# Patient Record
Sex: Female | Born: 1975 | Race: Black or African American | Hispanic: No | Marital: Married | Smoking: Never smoker
Health system: Southern US, Community
[De-identification: ages and names within clinical notes are randomized; demographics above are authoritative.]

## PROBLEM LIST (undated history)

## (undated) DIAGNOSIS — R110 Nausea: Secondary | ICD-10-CM

## (undated) DIAGNOSIS — G4700 Insomnia, unspecified: Secondary | ICD-10-CM

## (undated) DIAGNOSIS — I10 Essential (primary) hypertension: Secondary | ICD-10-CM

## (undated) DIAGNOSIS — F53 Postpartum depression: Secondary | ICD-10-CM

## (undated) DIAGNOSIS — R6889 Other general symptoms and signs: Secondary | ICD-10-CM

## (undated) DIAGNOSIS — F329 Major depressive disorder, single episode, unspecified: Secondary | ICD-10-CM

## (undated) DIAGNOSIS — D649 Anemia, unspecified: Secondary | ICD-10-CM

## (undated) DIAGNOSIS — F32A Depression, unspecified: Secondary | ICD-10-CM

## (undated) DIAGNOSIS — O99345 Other mental disorders complicating the puerperium: Secondary | ICD-10-CM

## (undated) DIAGNOSIS — N920 Excessive and frequent menstruation with regular cycle: Secondary | ICD-10-CM

## (undated) HISTORY — DX: Excessive and frequent menstruation with regular cycle: N92.0

## (undated) HISTORY — PX: OTHER SURGICAL HISTORY: SHX169

## (undated) HISTORY — DX: Other general symptoms and signs: R68.89

## (undated) HISTORY — DX: Postpartum depression: F53.0

## (undated) HISTORY — DX: Depression, unspecified: F32.A

## (undated) HISTORY — DX: Anemia, unspecified: D64.9

## (undated) HISTORY — DX: Insomnia, unspecified: G47.00

## (undated) HISTORY — DX: Major depressive disorder, single episode, unspecified: F32.9

## (undated) HISTORY — DX: Nausea: R11.0

## (undated) HISTORY — DX: Essential (primary) hypertension: I10

## (undated) HISTORY — DX: Other mental disorders complicating the puerperium: O99.345

---

## 2006-01-06 DIAGNOSIS — R6889 Other general symptoms and signs: Secondary | ICD-10-CM

## 2006-01-06 DIAGNOSIS — H579 Unspecified disorder of eye and adnexa: Secondary | ICD-10-CM

## 2006-01-06 HISTORY — DX: Other general symptoms and signs: R68.89

## 2006-01-06 HISTORY — DX: Unspecified disorder of eye and adnexa: H57.9

## 2006-05-21 ENCOUNTER — Inpatient Hospital Stay (HOSPITAL_COMMUNITY): Admission: AD | Admit: 2006-05-21 | Discharge: 2006-05-21 | Payer: Self-pay | Admitting: Obstetrics and Gynecology

## 2006-06-28 ENCOUNTER — Inpatient Hospital Stay (HOSPITAL_COMMUNITY): Admission: AD | Admit: 2006-06-28 | Discharge: 2006-06-28 | Payer: Self-pay | Admitting: Obstetrics and Gynecology

## 2006-08-08 ENCOUNTER — Inpatient Hospital Stay (HOSPITAL_COMMUNITY): Admission: AD | Admit: 2006-08-08 | Discharge: 2006-08-08 | Payer: Self-pay | Admitting: Obstetrics and Gynecology

## 2006-08-15 ENCOUNTER — Inpatient Hospital Stay (HOSPITAL_COMMUNITY): Admission: AD | Admit: 2006-08-15 | Discharge: 2006-08-16 | Payer: Self-pay | Admitting: Obstetrics and Gynecology

## 2006-08-25 ENCOUNTER — Inpatient Hospital Stay (HOSPITAL_COMMUNITY): Admission: AD | Admit: 2006-08-25 | Discharge: 2006-08-27 | Payer: Self-pay | Admitting: Obstetrics and Gynecology

## 2006-08-30 ENCOUNTER — Observation Stay (HOSPITAL_COMMUNITY): Admission: AD | Admit: 2006-08-30 | Discharge: 2006-08-30 | Payer: Self-pay | Admitting: Obstetrics and Gynecology

## 2006-10-05 ENCOUNTER — Inpatient Hospital Stay (HOSPITAL_COMMUNITY): Admission: AD | Admit: 2006-10-05 | Discharge: 2006-10-05 | Payer: Self-pay | Admitting: Obstetrics and Gynecology

## 2006-10-09 DIAGNOSIS — G4700 Insomnia, unspecified: Secondary | ICD-10-CM

## 2006-10-09 DIAGNOSIS — R110 Nausea: Secondary | ICD-10-CM

## 2006-10-09 DIAGNOSIS — D649 Anemia, unspecified: Secondary | ICD-10-CM

## 2006-10-09 DIAGNOSIS — O99345 Other mental disorders complicating the puerperium: Secondary | ICD-10-CM

## 2006-10-09 DIAGNOSIS — F53 Postpartum depression: Secondary | ICD-10-CM

## 2006-10-09 HISTORY — DX: Insomnia, unspecified: G47.00

## 2006-10-09 HISTORY — DX: Nausea: R11.0

## 2006-10-09 HISTORY — DX: Postpartum depression: F53.0

## 2006-10-09 HISTORY — DX: Other mental disorders complicating the puerperium: O99.345

## 2006-10-09 HISTORY — DX: Anemia, unspecified: D64.9

## 2007-06-01 ENCOUNTER — Other Ambulatory Visit: Admission: RE | Admit: 2007-06-01 | Discharge: 2007-06-01 | Payer: Self-pay | Admitting: Obstetrics and Gynecology

## 2008-01-12 ENCOUNTER — Emergency Department (HOSPITAL_COMMUNITY): Admission: EM | Admit: 2008-01-12 | Discharge: 2008-01-12 | Payer: Self-pay | Admitting: Emergency Medicine

## 2009-08-18 DIAGNOSIS — N920 Excessive and frequent menstruation with regular cycle: Secondary | ICD-10-CM

## 2009-08-18 HISTORY — DX: Excessive and frequent menstruation with regular cycle: N92.0

## 2010-07-17 ENCOUNTER — Emergency Department (HOSPITAL_COMMUNITY): Admission: EM | Admit: 2010-07-17 | Discharge: 2010-07-17 | Payer: Self-pay | Admitting: Emergency Medicine
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
## 2011-01-21 NOTE — Op Note (Signed)
[name], Amanda [name]              ACCOUNT NO.:  [id number]   MEDICAL RECORD NO.:  [id number]          PATIENT TYPE:  INP   LOCATION:  9164                          [facility]   PHYSICIAN:  Crist Fat. Rivard, M.D. DATE OF BIRTH:  09-22-1975   DATE OF PROCEDURE:  08/25/2006  DATE OF DISCHARGE:                               OPERATIVE REPORT   PREOPERATIVE DIAGNOSIS:  Postpartum hemorrhage.   POSTOPERATIVE DIAGNOSIS:  Postpartum hemorrhage.   ANESTHESIA:  Saddle block by Dr. Arby Barrette   PROCEDURE:  Dilatation and curettage.   SURGEON:  Crist Fat. Rivard, M.D.   ESTIMATED BLOOD LOSS:  800 mL.   DESCRIPTION OF PROCEDURE:  After being informed of the planned procedure  with possible complications, the patient is taken from labor and  delivery to OR #3.  She is given a saddle block by Dr. Arby Barrette with no  complication and placed in the lithotomy position.  She is prepped and  draped in a sterile fashion and a weighted speculum is inserted in her  vagina.  With a Deaver retractor, we are able to identify the anterior  lip of the cervix which is grasped with a sponge forceps.  Using a  second sponge forceps, we evaluate the entire cervix to note no cervical  laceration.  A large curet is then inserted in the uterus and the uterus  was systematically curetted returning only blood clots, no tissue was  identified.  Instruments were removed and a bimanual massage was  performed as well as a manual revision of the uterus which reveals a  firm product free cavity.  During the procedure, the patient is given a  perfusion of 40 units of Pitocin in 1000 mL of LR as well as two doses  of Methergine 0.2 mg IM. We then observed for another five minutes and  bleeding had subsided. A Foley catheter is inserted and it will remain  in the bladder until tomorrow morning.   Instruments and sponge count is complete x2.  Estimated blood loss  before and during procedure is 800 mL for a total blood  loss during this  delivery process of 1200 mL. The patient has remained normotensive and  with a normal heart rate and has tolerated the procedure very well.  She  is taken in a stable condition to the PACU.      Crist Fat Rivard, M.D.  Electronically Signed     SAR/MEDQ  D:  08/25/2006  T:  08/25/2006  Job:  [id number]

## 2011-01-21 NOTE — Discharge Summary (Signed)
NAMEALDENA, Amanda [name]              ACCOUNT NO.:  [id number]   MEDICAL RECORD NO.:  [id number]          PATIENT TYPE:  INP   LOCATION:  9124                          [facility]   PHYSICIAN:  Janine Limbo, M.D.DATE OF BIRTH:  08-Aug-1976   DATE OF ADMISSION:  08/25/2006  DATE OF DISCHARGE:  08/27/2006                               DISCHARGE SUMMARY   ADMITTING DIAGNOSES:  1. Intrauterine pregnancy at term.  2. Early labor.   PROCEDURE:  Normal spontaneous vaginal delivery.   PREOPERATIVE DIAGNOSIS:  Postpartum hemorrhage.   PROCEDURE:  D&C.   DISCHARGE DIAGNOSES:  1. Intrauterine pregnancy at term delivered.  2. Normal spontaneous vaginal delivery.  3. Postpartum hemorrhage with dilatation and curettage.  4. Anemia.   HOSPITAL COURSE:  Amanda [name] is a 35 year old gravida 2, para 2-0-0-2  who presented at 40-3/7 weeks in early labor.  Her labor progressed and  she went on to normal spontaneous vaginal delivery on August 25, 2006  with Dr. Dois Davenport Rivard in attendance.  The patient gave birth to a 7-  pound 7-ounce female infant named Abigail with Apgar scores of 9 at one  minute and 9 at five minutes.  Uterine atony was noted at time of  delivery and Cytotec 1000 mcg were given per rectum however, bleeding  was not controlled.  The patient continued to have heavy bleeding and  clotting.  She was taken to the OR for a postpartum hemorrhage and D&C  was performed by Dr. Dois Davenport Rivard.  Postop hemoglobin the next morning  was noted to be 6.3.  The patient was symptomatic with her anemia,  however, she is Jehovah's Witness and declined transfusions.  Hetastarch  was reviewed by Dr. Stefano Gaul as a colloid plasma expander with no blood  products.  This was given to the patient and she reports improvement.  She is no longer dizzy when out of bed, no syncope, her orthostatic  vital signs on August 27, 2006 at 6:30 a.m. are all within normal  limits.  Her vital signs have  remained stable.  She is afebrile.  Her  fundus is firm at this point to below the umbilicus with moderate  lochia.  Her physical exam is otherwise within normal limits and she is  judged to be in satisfactory condition for discharge.  Discharge  instructions per [facility] handout.   DISCHARGE MEDICATIONS:  1. Motrin 600 mg p.o. q.6 h. p.r.n. pain.  2. Tylox one to two p.o. q.3-4 h. p.r.n. pain.  3. Prenatal vitamins and iron supplement p.o. b.i.d.   DISCHARGE INSTRUCTIONS:  The patient will rest at home and increase  fluids, iron rich foods were also discussed.  She will call for any  problems or concerns and be seen for her normal postpartum visit at 6  weeks.      Amanda [name], C.N.M.      Janine Limbo, M.D.  Electronically Signed    SDM/MEDQ  D:  08/27/2006  T:  08/27/2006  Job:  [id number]

## 2011-01-21 NOTE — Consult Note (Signed)
NAME:  BABETTA, PATERSON NO.:  [id number]   MEDICAL RECORD NO.:  [id number]          PATIENT TYPE:  MAT   LOCATION:  MATC                          [facility]  [facility]   PHYSICIAN:  Janine Limbo, M.D.DATE OF BIRTH:  11/07/1975   DATE OF CONSULTATION:  DATE OF DISCHARGE:                                 CONSULTATION   HISTORY OF PRESENT ILLNESS:  [name] is a 35 year old female, now  para 2-0-0-2, who presented to the emergency room at the [facility] and then to the maternity admissions area at the [facility] [place] [place] complaining of fatigue and dizziness.  The  patient had a vaginal delivery on August 25, 2006.  She delivered a  healthy female infant that weighed 7 pounds and 7 ounces.  The Apgar  scores were 9 at one minute and 9 at five minutes.  The patient  experienced uterine atony following delivery and multiple medications  were required to control her bleeding.  A dilatation and curettage was  performed.  We were able to stop her bleeding.  Her postpartum  hemoglobin was 6.3.  The patient was noted to be orthostatic.  The  patient was given hetastarch after her delivery and she did feel  somewhat better.  The patient was discharged to home on August 27, 2006.  The patient presents today complaining of fatigue and dizziness.  She reports that she is simply unable to care for her infant.  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   OBSTETRICAL HISTORY:  1. In 2006, the patient had a vaginal delivery of a female infant at      term.  2. On August 25, 2006, she had a vaginal delivery of a healthy      female infant.   PAST MEDICAL HISTORY:  The patient has a history of postpartum  depression.   SOCIAL HISTORY:  Noncontributory.   FAMILY HISTORY:  The patient's has hypertension.   REVIEW OF SYSTEMS:  The patient does have a past history of  constipation.   DRUG ALLERGIES:  NO  KNOWN DRUG ALLERGIES.   PHYSICAL EXAMINATION:  VITAL SIGNS:  Temperature is 98.3, pulse is 88,  blood pressure of 132/71, respirations are 20.  HEENT:  Within normal limits, except that the patient does appear to be  tired.  CHEST: Clear.  HEART:  Regular rate and rhythm and there is a grade 2/6 systolic  ejection murmur.  ABDOMEN:  Slightly tender to palpation.  Her fundus is firm.  EXTREMITIES:  Grossly normal.  NEUROLOGIC:  Grossly normal.  PELVIC:  There is minimal bleeding on the patient's pad.   LABORATORY VALUES:  Hemoglobin today is 5.8.  Hematocrit is 17.  Blood  gas shows a pH of 7.368.  O2 sat is 99% on room air.   ASSESSMENT:  1. Postpartum day number five.  2. Status post uterine atony with the dilatation and curettage.  3. Anemia (hemoglobin 5.8).  4. Orthostasis.  5. Jehovah's Witness - the patient declines all blood products.  6. History of postpartum depression.   PLAN:  A long discussion was held with the patient and her husband.  We  reviewed the patients options when she was in the hospital, and we once  again reviewed her options.  Because she declines blood products, we are  somewhat limited.  We will give another 500 mL of hetastarch.  We will  also give Aranesp 300 mcg subcutaneously.  They understand that this may  make her feel slightly better for the moment.  They also understand that  it will be four to six weeks before she will be able to significantly  increase her hemoglobin using iron therapy.  The patient was again  offered blood products which she declines.  The patient reports that she  has no family members here in town and that perhaps if she can get to  [place] then there will be someone to help care for her and her baby  there.  I offered the patient hospitalization but she understands that  her insurance company may not pay for her to be hospitalized for two-to-  three-to-four weeks until she feels better by building her hemoglobin  with  iron therapy.      Janine Limbo, M.D.  Electronically Signed     AVS/MEDQ  D:  08/30/2006  T:  08/30/2006  Job:  [id number]

## 2011-01-21 NOTE — Discharge Summary (Signed)
Amanda [name], Amanda [name]              ACCOUNT NO.:  [id number]   MEDICAL RECORD NO.:  [id number]          PATIENT TYPE:  OBV   LOCATION:  9399                          [facility]   PHYSICIAN:  Janine Limbo, M.D.DATE OF BIRTH:  01-Nov-1975   DATE OF ADMISSION:  08/30/2006  DATE OF DISCHARGE:  08/30/2006                               DISCHARGE SUMMARY   DISCHARGE DIAGNOSES:  1. Postpartum day #5.  2. Status post uterine atony and status post dilatation and curettage.  3. Anemia (hemoglobin 5.8).  4. Orthostasis.  5. Jehovah's Witness -- the patient declined all blood products.  6. History of postpartum depression.   PROCEDURES THIS ADMISSION:  None.   HISTORY OF PRESENT ILLNESS:  Amanda [name] is a 35 year old female, now  para 2-0-0-2, who presented to the emergency department at [facility]  and then to the maternity admissions area at the [facility]  [place] on August 30, 2006.  The patient had a vaginal delivery on  August 25, 2006, where she delivered a healthy female infant that  weighed 7 pounds 7 ounces.  The patient experienced uterine atony  following her delivery and multiple medications were required to obtain  The patient was orthostatic postpartum and her postpartum hemoglobin was  6.3.  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  She was given hetastarch when in the hospital.  She was  discharged to home on August 27, 2006.  She presents today complaining  of fatigue, dizziness, and reporting that she simply unable to care for  her infant because of how badly she feels.   ADMISSION EXAMINATION:  VITAL SIGNS:  Temperature 98 degrees, pulse 88,  blood pressure 132/71, respirations 20.  GENERAL:  Within normal limits.  CARDIAC:  She did have a grade 2/6 systolic ejection murmur.  PELVIC:  She was not having any bleeding.   HOSPITAL COURSE:  The patient was evaluated in the maternity admissions  area at the [facility].  Her hemoglobin today is  5.7.  Her arterial blood gas was 7.368.  Her O2 saturation was 99% on  room air.  We discussed possible treatment options, which were  essentially the same as when the patient was in the hospital.  We did  give her hetastarch once again.  She felt somewhat better and was ready  for discharge.   FOLLOW-UP INSTRUCTIONS:  The patient will follow up in our office as  planned.  She will take iron 325 mg three times each day.  She will  increase her vitamin C.  She will call us for questions or concerns.      Janine Limbo, M.D.  Electronically Signed     AVS/MEDQ  D:  09/25/2006  T:  09/25/2006  Job:  [id number]

## 2011-09-06 LAB — HM PAP SMEAR

## 2011-09-19 ENCOUNTER — Emergency Department (INDEPENDENT_AMBULATORY_CARE_PROVIDER_SITE_OTHER)
Admission: EM | Admit: 2011-09-19 | Discharge: 2011-09-19 | Disposition: A | Discharge status: Home / Self Care | Payer: 59 | Source: Home / Self Care | Attending: Family Medicine | Admitting: Family Medicine

## 2011-09-19 ENCOUNTER — Other Ambulatory Visit: Payer: Self-pay

## 2011-09-19 DIAGNOSIS — S29011A Strain of muscle and tendon of front wall of thorax, initial encounter: Secondary | ICD-10-CM

## 2011-09-19 DIAGNOSIS — M62830 Muscle spasm of back: Secondary | ICD-10-CM

## 2011-09-19 DIAGNOSIS — IMO0002 Reserved for concepts with insufficient information to code with codable children: Secondary | ICD-10-CM

## 2011-09-19 DIAGNOSIS — M5380 Other specified dorsopathies, site unspecified: Secondary | ICD-10-CM

## 2011-09-19 MED ORDER — CYCLOBENZAPRINE HCL 10 MG PO TABS: 10.0000 mg | ORAL_TABLET | Freq: Two times a day (BID) | ORAL | Status: AC | PRN

## 2011-09-19 MED ORDER — IBUPROFEN 600 MG PO TABS: 600.0000 mg | ORAL_TABLET | Freq: Three times a day (TID) | ORAL | Status: AC | PRN

## 2011-09-19 NOTE — ED Notes (Signed)
PT HERE WITH LEFT CHEST WAL PAIN RADIATING UNDER BREAT THAT FIRST STARTED ON Saturday.NEW ONSET FOR PT.PT WENT TO [facility] AND WORKED UP.EKG NEG AND PRESCRIBED NAPROSYN SODIUM 550MG  TAB BUT PT STATES THE PAIN HASN'T SUBSIDED.TRIED IBUPROFEN 800MG  TAB BUT NO RELIEF.PT ALSO REPORTS OF  WEAKNESS

## 2011-09-19 NOTE — ED Provider Notes (Signed)
History     CSN: [id number]  Arrival date & time 09/19/11  0920   First MD Initiated Contact with Patient 09/19/11 1008      Chief Complaint  Patient presents with  . Chest Pain  . Weakness    (Consider location/radiation/quality/duration/timing/severity/associated sxs/prior treatment) HPI Comments: 36 y/o female no significant PMH. Here c/o pain in left upper back and below ribs in left anterior chest. Pain started about 1 week ago after a "zumba"  Class. Patient had exacerbation with left arm elevation that is improved but has constant persistent pain in left upper back that radiated to anterior chest when she elevated her shoulders while bending forward, (only certain movements trigger her pain) . Was seen at [facility] 2 days ago ad normal EKG and Xrays. Naproxen was prescribed and "other pill that made me feel weak". Pt took ibuprofen 800 mg x1 yesterday with some relief. No sweating, no dizziness, no palpitations, cough, leg swelling or shortness of breath.   No past medical history on file.  No past surgical history on file.  No family history on file.  History  Substance Use Topics  . Smoking status: Not on file  . Smokeless tobacco: Not on file  . Alcohol Use: Not on file    OB History    No data available      Review of Systems  Constitutional: Negative for fever, chills and fatigue.  Respiratory: Negative for cough, chest tightness, shortness of breath and wheezing.   Cardiovascular: Negative for palpitations and leg swelling.  Neurological: Negative for dizziness and headaches.    Allergies  Review of patient's allergies indicates not on file.  Home Medications   Current Outpatient Rx  Name Route Sig Dispense Refill  . CYCLOBENZAPRINE HCL 10 MG PO TABS Oral Take 1 tablet (10 mg total) by mouth 2 (two) times daily as needed for muscle spasms. 20 tablet 0  . IBUPROFEN 600 MG PO TABS Oral Take 1 tablet (600 mg total) by mouth every 8 (eight) hours  as needed for pain. 30 tablet 0    Take with food    BP 112/72  Pulse 74  Temp(Src) 98.6 F (37 C) (Oral)  Resp 20  SpO2 100%  Physical Exam  Nursing note and vitals reviewed. Constitutional: She is oriented to person, place, and time. She appears well-developed and well-nourished. No distress.  HENT:  Head: Normocephalic and atraumatic.  Eyes: Pupils are equal, round, and reactive to light.  Neck: Normal range of motion. Neck supple. No thyromegaly present.  Cardiovascular: Normal rate, regular rhythm, normal heart sounds and intact distal pulses.  Exam reveals no gallop and no friction rub.   No murmur heard. Pulmonary/Chest: Effort normal and breath sounds normal. No respiratory distress. She has no wheezes. She has no rales.  Abdominal: Soft. There is no tenderness.  Musculoskeletal:       There is increased tone and triggered pain with deep palpation at left upper back between scapula and spine. Also increased discomfort with left shoulder and arm posterior extension against resistance. Patient reported pain radiating to front.  No tenderness with anterior chest palpation.  Lymphadenopathy:    She has no cervical adenopathy.  Neurological: She is alert and oriented to person, place, and time.  Skin: No rash noted.    ED Course  Procedures (including critical care time)  Labs Reviewed - No data to display No results found.   1. Muscle spasm of back   2. Chest  wall muscle strain       MDM  Impress muscle skeletal in origin. Possible thoracic nerve root irritation. Symptomatic treatment. EKG: rate 67, NSR, no ST changes or Q waves. No ischemic changes (normal)        Sharin Grave, MD 09/20/11 1124

## 2011-09-21 NOTE — ED Notes (Signed)
Pt. came in and requested a noted saying she can lift 30 lbs., so she can go back to work.  Pt. has restrictions on her work note of no lifting >25 lbs x 7 days.  I asked if she is able to lift her daughter who is 50 lbs. without pain and she said no. I told her then I can't lift the restriction. She would need to see the doctor. She does not want to have to check in and asked to speak to the doctor.  Dr. Tressia Danas said talked to the pt. And said she can change the note to say she can lift up to 30 lbs for 7 days.  Note done as directed and given to pt. Vassie Moselle 09/21/2011

## 2011-11-15 DIAGNOSIS — F32A Depression, unspecified: Secondary | ICD-10-CM | POA: Insufficient documentation

## 2011-11-15 DIAGNOSIS — F329 Major depressive disorder, single episode, unspecified: Secondary | ICD-10-CM | POA: Insufficient documentation

## 2011-12-05 ENCOUNTER — Other Ambulatory Visit: Payer: Self-pay | Admitting: Obstetrics and Gynecology

## 2011-12-05 DIAGNOSIS — R109 Unspecified abdominal pain: Secondary | ICD-10-CM

## 2011-12-14 ENCOUNTER — Other Ambulatory Visit: Payer: 59

## 2011-12-14 ENCOUNTER — Other Ambulatory Visit: Payer: Self-pay

## 2011-12-14 ENCOUNTER — Encounter: Payer: 59 | Admitting: Obstetrics and Gynecology

## 2012-03-21 ENCOUNTER — Encounter: Payer: 59 | Admitting: Obstetrics and Gynecology

## 2012-03-21 ENCOUNTER — Ambulatory Visit: Payer: 59

## 2012-04-04 ENCOUNTER — Encounter: Payer: Self-pay | Admitting: Obstetrics and Gynecology

## 2012-04-04 ENCOUNTER — Ambulatory Visit (INDEPENDENT_AMBULATORY_CARE_PROVIDER_SITE_OTHER): Payer: 59 | Admitting: Obstetrics and Gynecology

## 2012-04-04 ENCOUNTER — Ambulatory Visit (INDEPENDENT_AMBULATORY_CARE_PROVIDER_SITE_OTHER): Payer: 59

## 2012-04-04 VITALS — BP 122/80 | Temp 98.2°F | Wt 209.0 lb

## 2012-04-04 DIAGNOSIS — R109 Unspecified abdominal pain: Secondary | ICD-10-CM

## 2012-04-04 NOTE — Progress Notes (Signed)
GYN PROBLEM VISIT  Subjective: [name] is a 36 y.o. year old female, who presents for a problem visit.  She notes some abdominal "stomach" pain after eating "things I'M allergic to "like broccoli ,sweet potatoes, and greens. She denies nausea vomiting constipation or diarrhea. 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  Objective:  BP 122/80  Temp 98.2 F (36.8 C)  Wt 209 lb (94.802 kg)  LMP 03/31/2012   ULTRASOUND: Uterus: Length: 9.56 cm   Width:  6.37 cm   Height:  5.35 cm  Endo thickness:  9 mm   Left ovary:Normal Right ovary:Normal Fibroids:no  Number:  0n/a  Size(s):0 cm n/a  Largest diameter CDS fluid:no  Comment: urinary bladder unremarkable  IUD visualized in correct position in the uterine cavity by 3-D images   Assessment: Gastrointestinal discomfort secondary to difficulty digesting complex carbohydrates  Plan: Began Beano dietary supplement with all complex carbohydrate meal If no improvement will make a GI referral Return to office {for annual exam  Dierdre Forth,   04/04/2012 6:41 PM

## 2012-04-06 ENCOUNTER — Other Ambulatory Visit: Payer: Self-pay | Admitting: Obstetrics and Gynecology

## 2012-04-06 DIAGNOSIS — R109 Unspecified abdominal pain: Secondary | ICD-10-CM

## 2013-01-14 ENCOUNTER — Encounter: Payer: Self-pay | Admitting: Internal Medicine

## 2013-01-14 ENCOUNTER — Ambulatory Visit (INDEPENDENT_AMBULATORY_CARE_PROVIDER_SITE_OTHER): Payer: 59 | Admitting: Internal Medicine

## 2013-01-14 ENCOUNTER — Other Ambulatory Visit (INDEPENDENT_AMBULATORY_CARE_PROVIDER_SITE_OTHER): Payer: 59

## 2013-01-14 VITALS — BP 120/78 | HR 86 | Temp 98.0°F | Ht 67.0 in | Wt 202.5 lb

## 2013-01-14 DIAGNOSIS — Z13220 Encounter for screening for lipoid disorders: Secondary | ICD-10-CM

## 2013-01-14 DIAGNOSIS — Z0000 Encounter for general adult medical examination without abnormal findings: Secondary | ICD-10-CM

## 2013-01-14 DIAGNOSIS — Z130 Encounter for screening for diseases of the blood and blood-forming organs and certain disorders involving the immune mechanism: Secondary | ICD-10-CM

## 2013-01-14 DIAGNOSIS — Z131 Encounter for screening for diabetes mellitus: Secondary | ICD-10-CM

## 2013-01-14 DIAGNOSIS — Z1329 Encounter for screening for other suspected endocrine disorder: Secondary | ICD-10-CM

## 2013-01-14 LAB — BASIC METABOLIC PANEL
BUN: 10 mg/dL (ref 6–23)
CO2: 24 mEq/L (ref 19–32)
Calcium: 9.1 mg/dL (ref 8.4–10.5)
Chloride: 109 mEq/L (ref 96–112)
Creatinine, Ser: 0.6 mg/dL (ref 0.4–1.2)
GFR: 147.64 mL/min (ref >60.00)
Glucose, Bld: 92 mg/dL (ref 70–99)
Potassium: 3.2 mEq/L — ABNORMAL LOW (ref 3.5–5.1)
Sodium: 138 mEq/L (ref 135–145)

## 2013-01-14 LAB — LIPID PANEL
Cholesterol: 131 mg/dL (ref 0–200)
HDL: 46.7 mg/dL (ref >39.00)
LDL Cholesterol: 66 mg/dL (ref 0–99)
Total CHOL/HDL Ratio: 3
Triglycerides: 90 mg/dL (ref 0.0–149.0)
VLDL: 18 mg/dL (ref 0.0–40.0)

## 2013-01-14 LAB — CBC
HCT: 27.5 % — ABNORMAL LOW (ref 36.0–46.0)
Hemoglobin: 9 g/dL — ABNORMAL LOW (ref 12.0–15.0)
MCHC: 32.6 g/dL (ref 30.0–36.0)
MCV: 65 fl — ABNORMAL LOW (ref 78.0–100.0)
Platelets: 293 10*3/uL (ref 150.0–400.0)
RBC: 4.23 Mil/uL (ref 3.87–5.11)
RDW: 17 % — ABNORMAL HIGH (ref 11.5–14.6)
WBC: 8.1 10*3/uL (ref 4.5–10.5)

## 2013-01-14 LAB — HEMOGLOBIN A1C: Hgb A1c MFr Bld: 5.6 % (ref 4.6–6.5)

## 2013-01-14 NOTE — Patient Instructions (Signed)
Health Maintenance, Females A healthy lifestyle and preventative care can promote health and wellness.  Maintain regular health, dental, and eye exams.  Eat a healthy diet. Foods like vegetables, fruits, whole grains, low-fat dairy products, and lean protein foods contain the nutrients you need without too many calories. Decrease your intake of foods high in solid fats, added sugars, and salt. Get information about a proper diet from your caregiver, if necessary.  Regular physical exercise is one of the most important things you can do for your health. Most adults should get at least 150 minutes of moderate-intensity exercise (any activity that increases your heart rate and causes you to sweat) each week. In addition, most adults need muscle-strengthening exercises on 2 or more days a week.   Maintain a healthy weight. The body mass index (BMI) is a screening tool to identify possible weight problems. It provides an estimate of body fat based on height and weight. Your caregiver can help determine your BMI, and can help you achieve or maintain a healthy weight. For adults 20 years and older:  A BMI below 18.5 is considered underweight.  A BMI of 18.5 to 24.9 is normal.  A BMI of 25 to 29.9 is considered overweight.  A BMI of 30 and above is considered obese.  Maintain normal blood lipids and cholesterol by exercising and minimizing your intake of saturated fat. Eat a balanced diet with plenty of fruits and vegetables. Blood tests for lipids and cholesterol should begin at age 20 and be repeated every 5 years. If your lipid or cholesterol levels are high, you are over 50, or you are a high risk for heart disease, you may need your cholesterol levels checked more frequently.Ongoing high lipid and cholesterol levels should be treated with medicines if diet and exercise are not effective.  If you smoke, find out from your caregiver how to quit. If you do not use tobacco, do not start.  If you  are pregnant, do not drink alcohol. If you are breastfeeding, be very cautious about drinking alcohol. If you are not pregnant and choose to drink alcohol, do not exceed 1 drink per day. One drink is considered to be 12 ounces (355 mL) of beer, 5 ounces (148 mL) of wine, or 1.5 ounces (44 mL) of liquor.  Avoid use of street drugs. Do not share needles with anyone. Ask for help if you need support or instructions about stopping the use of drugs.  High blood pressure causes heart disease and increases the risk of stroke. Blood pressure should be checked at least every 1 to 2 years. Ongoing high blood pressure should be treated with medicines, if weight loss and exercise are not effective.  If you are 55 to 37 years old, ask your caregiver if you should take aspirin to prevent strokes.  Diabetes screening involves taking a blood sample to check your fasting blood sugar level. This should be done once every 3 years, after age 45, if you are within normal weight and without risk factors for diabetes. Testing should be considered at a younger age or be carried out more frequently if you are overweight and have at least 1 risk factor for diabetes.  Breast cancer screening is essential preventative care for women. You should practice "breast self-awareness." This means understanding the normal appearance and feel of your breasts and may include breast self-examination. Any changes detected, no matter how small, should be reported to a caregiver. Women in their 20s and 30s should have   a clinical breast exam (CBE) by a caregiver as part of a regular health exam every 1 to 3 years. After age 40, women should have a CBE every year. Starting at age 40, women should consider having a mammogram (breast X-ray) every year. Women who have a family history of breast cancer should talk to their caregiver about genetic screening. Women at a high risk of breast cancer should talk to their caregiver about having an MRI and a  mammogram every year.  The Pap test is a screening test for cervical cancer. Women should have a Pap test starting at age 21. Between ages 21 and 29, Pap tests should be repeated every 2 years. Beginning at age 30, you should have a Pap test every 3 years as long as the past 3 Pap tests have been normal. If you had a hysterectomy for a problem that was not cancer or a condition that could lead to cancer, then you no longer need Pap tests. If you are between ages 65 and 70, and you have had normal Pap tests going back 10 years, you no longer need Pap tests. If you have had past treatment for cervical cancer or a condition that could lead to cancer, you need Pap tests and screening for cancer for at least 20 years after your treatment. If Pap tests have been discontinued, risk factors (such as a new sexual partner) need to be reassessed to determine if screening should be resumed. Some women have medical problems that increase the chance of getting cervical cancer. In these cases, your caregiver may recommend more frequent screening and Pap tests.  The human papillomavirus (HPV) test is an additional test that may be used for cervical cancer screening. The HPV test looks for the virus that can cause the cell changes on the cervix. The cells collected during the Pap test can be tested for HPV. The HPV test could be used to screen women aged 30 years and older, and should be used in women of any age who have unclear Pap test results. After the age of 30, women should have HPV testing at the same frequency as a Pap test.  Colorectal cancer can be detected and often prevented. Most routine colorectal cancer screening begins at the age of 50 and continues through age 75. However, your caregiver may recommend screening at an earlier age if you have risk factors for colon cancer. On a yearly basis, your caregiver may provide home test kits to check for hidden blood in the stool. Use of a small camera at the end of a  tube, to directly examine the colon (sigmoidoscopy or colonoscopy), can detect the earliest forms of colorectal cancer. Talk to your caregiver about this at age 50, when routine screening begins. Direct examination of the colon should be repeated every 5 to 10 years through age 75, unless early forms of pre-cancerous polyps or small growths are found.  Hepatitis C blood testing is recommended for all people born from 1945 through 1965 and any individual with known risks for hepatitis C.  Practice safe sex. Use condoms and avoid high-risk sexual practices to reduce the spread of sexually transmitted infections (STIs). Sexually active women aged 25 and younger should be checked for Chlamydia, which is a common sexually transmitted infection. Older women with new or multiple partners should also be tested for Chlamydia. Testing for other STIs is recommended if you are sexually active and at increased risk.  Osteoporosis is a disease in which the   bones lose minerals and strength with aging. This can result in serious bone fractures. The risk of osteoporosis can be identified using a bone density scan. Women ages 65 and over and women at risk for fractures or osteoporosis should discuss screening with their caregivers. Ask your caregiver whether you should be taking a calcium supplement or vitamin D to reduce the rate of osteoporosis.  Menopause can be associated with physical symptoms and risks. Hormone replacement therapy is available to decrease symptoms and risks. You should talk to your caregiver about whether hormone replacement therapy is right for you.  Use sunscreen with a sun protection factor (SPF) of 30 or greater. Apply sunscreen liberally and repeatedly throughout the day. You should seek shade when your shadow is shorter than you. Protect yourself by wearing long sleeves, pants, a wide-brimmed hat, and sunglasses year round, whenever you are outdoors.  Notify your caregiver of new moles or  changes in moles, especially if there is a change in shape or color. Also notify your caregiver if a mole is larger than the size of a pencil eraser.  Stay current with your immunizations. Document Released: 03/07/2011 Document Revised: 11/14/2011 Document Reviewed: 03/07/2011 [employer] Patient Information 2013 [employer]. Breast Self-Exam A self breast exam may help you find changes or problems while they are still small. Do a breast self-exam:  Every month.  One week after your period (menstrual period).  On the first day of each month if you do not have periods anymore. Look for any:  Change in breast color, size, or shape.  Dimples in your breast.  Changes in your nipples or skin.  Dry skin on your breasts or nipples.  Watery or bloody discharge from your nipples.  Feel for:  Lumps.  Thick, hard places.  Any other changes. HOME CARE There are 3 ways to do the breast self-exam: In front of a mirror.  Lift your arms over your head and turn side to side.  Put your hands on your hips and lean down, then turn from side to side.  Bend forward and turn from side to side. In the shower.  With soapy hands, check both breasts. Then check above and below your collarbone and your armpits.  Feel above and below your collarbone down to under your breast, and from the center of your chest to the outer edge of the armpit. Check for any lumps or hard spots.  Using the tips of your middle three fingers check your whole breast by pressing your hand over your breast in a circle or in an up and down motion. Lying down.  Lie flat on your bed.  Put a small pillow under the breast you are going to check. On that same side, put your hand behind your head.  With your other hand, use the 3 middle fingers to feel the breast.  Move your fingers in a circle around the breast. Press firmly over all parts of the breast to feel for any lumps. GET HELP RIGHT AWAY IF: You find any  changes in your breasts so they can be checked. Document Released: 02/08/2008 Document Revised: 11/14/2011 Document Reviewed: 12/10/2008 [employer] Patient Information 2013 [employer].  

## 2013-01-14 NOTE — Progress Notes (Signed)
HPI  Pt presents to the clinic today to establish care. She does not have a PCP only a OB/GYN. 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 Other than that, she has no concerns today.  Flu: 09/2012 Tdap: 2011 LMP: 01/03/2013 Pap smear: 09/2011 Dentist: as needed  Past Medical History  Diagnosis Date  . Depression   . Insomnia 10/09/2006  . Anemia 10/09/2006  . Menorrhagia 08/18/2009  . 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 Nausea 10/09/2006  . Itchy eyes 01/06/2006    No current outpatient prescriptions on file.   No current facility-administered medications for this visit.    No Known Allergies  Family History  Problem Relation Age of Onset  . Hypertension Mother   . Cancer Neg Hx     History   Social History  . Marital Status: Married    Spouse Name: N/A    Number of Children: N/A  . Years of Education: N/A   Occupational History  . Not on file.   Social History Main Topics  . Smoking status: Never Smoker   . Smokeless tobacco: Never Used  . Alcohol Use: No  . Drug Use: No  . Sexually Active: Yes   Other Topics Concern  . Not on file   Social History Narrative  . No narrative on file    ROS:  Constitutional: Denies fever, malaise, fatigue, headache or abrupt weight changes.  HEENT: Denies eye pain, eye redness, ear pain, ringing in the ears, wax buildup, runny nose, nasal congestion, bloody nose, or sore throat. Respiratory: Denies difficulty breathing, shortness of breath, cough or sputum production.   Cardiovascular: Denies chest pain, chest tightness, palpitations or swelling in the hands or feet.  Gastrointestinal: Denies abdominal pain, bloating, constipation, diarrhea or blood in the stool.  GU: Denies frequency, urgency, pain with urination, blood in urine, odor or discharge. Musculoskeletal: Denies decrease in range of motion, difficulty with gait, muscle pain or joint pain and swelling.  Skin: Denies redness, rashes,  lesions or ulcercations.  Neurological: Denies dizziness, difficulty with memory, difficulty with speech or problems with balance and coordination.   No other specific complaints in a complete review of systems (except as listed in HPI above).  PE:  BP 120/78  Pulse 86  Temp(Src) 98 F (36.7 C) (Oral)  Ht 5\' 7"  (1.702 m)  Wt 202 lb 8 oz (91.853 kg)  BMI 31.71 kg/m2  SpO2 97% Wt Readings from Last 3 Encounters:  01/14/13 202 lb 8 oz (91.853 kg)  04/04/12 209 lb (94.802 kg)    General: Appears her stated age, obese but well developed, well nourished in NAD. HEENT: Head: normal shape and size; Eyes: sclera white, no icterus, conjunctiva pink, PERRLA and EOMs intact; Ears: Tm's gray and intact, normal light reflex; Nose: mucosa pink and moist, septum midline; Throat/Mouth: Teeth present, mucosa pink and moist, no lesions or ulcerations noted.  Neck: Normal range of motion. Neck supple, trachea midline. No massses, lumps or thyromegaly present.  Cardiovascular: Normal rate and rhythm. S1,S2 noted.  No murmur, rubs or gallops noted. No JVD or BLE edema. No carotid bruits noted. Pulmonary/Chest: Normal effort and positive vesicular breath sounds. No respiratory distress. No wheezes, rales or ronchi noted.  Abdomen: Soft and nontender. Normal bowel sounds, no bruits noted. No distention or masses noted. Liver, spleen and kidneys non palpable. Musculoskeletal: Normal range of motion. No signs of joint swelling. No difficulty with gait.  Neurological: Alert and oriented.  Cranial nerves II-XII intact. Coordination normal. +DTRs bilaterally. Psychiatric: Mood and affect normal. Behavior is normal. Judgment and thought content normal.      Assessment and Plan:  Preventative Health Maintenance:  Will obtain basic screening labs today Need TB test for forms to be filled out Visit a dentist at least yearly Start a diet and exercise program  RTC in 1 year or sooner if needed

## 2013-01-15 LAB — TSH: TSH: 2.52 u[IU]/mL (ref 0.35–5.50)

## 2013-01-16 LAB — QUANTIFERON TB GOLD ASSAY (BLOOD)
Interferon Gamma Release Assay: NEGATIVE
Mitogen value: 9.39 IU/mL
Quantiferon Nil Value: 0.03 IU/mL
Quantiferon Tb Ag Minus Nil Value: 0.04 IU/mL
TB Ag value: 0.07 IU/mL

## 2013-03-19 ENCOUNTER — Encounter (HOSPITAL_COMMUNITY): Payer: Self-pay

## 2013-03-19 ENCOUNTER — Emergency Department (HOSPITAL_COMMUNITY)
Admission: EM | Admit: 2013-03-19 | Discharge: 2013-03-19 | Disposition: A | Discharge status: Home / Self Care | Payer: 59 | Attending: Emergency Medicine | Admitting: Emergency Medicine

## 2013-03-19 DIAGNOSIS — R52 Pain, unspecified: Secondary | ICD-10-CM | POA: Insufficient documentation

## 2013-03-19 NOTE — ED Notes (Signed)
Pt reports body aches, chillls, dizzy,  "just don't feel good" x several days.

## 2013-03-19 NOTE — ED Notes (Signed)
Unable to locate pt in all waiting areas x 3 

## 2013-09-10 ENCOUNTER — Ambulatory Visit (INDEPENDENT_AMBULATORY_CARE_PROVIDER_SITE_OTHER): Payer: 59 | Admitting: Internal Medicine

## 2013-09-10 ENCOUNTER — Encounter: Payer: Self-pay | Admitting: *Deleted

## 2013-09-10 ENCOUNTER — Encounter: Payer: Self-pay | Admitting: Internal Medicine

## 2013-09-10 VITALS — BP 126/84 | HR 87 | Temp 99.5°F | Wt 210.0 lb

## 2013-09-10 DIAGNOSIS — B9789 Other viral agents as the cause of diseases classified elsewhere: Principal | ICD-10-CM

## 2013-09-10 DIAGNOSIS — J069 Acute upper respiratory infection, unspecified: Secondary | ICD-10-CM

## 2013-09-10 MED ORDER — PHENYLEPH-PROMETHAZINE-COD 5-6.25-10 MG/5ML PO SYRP: 5.0000 mL | ORAL_SOLUTION | ORAL | Status: DC | PRN

## 2013-09-10 NOTE — Patient Instructions (Addendum)
It was good to see you today.  If you develop worsening symptoms or fever, call and we can reconsider antibiotics, but it does not appear necessary to use antibiotics at this time.  prescription cough and decongestant syrup - Your prescription(s) have been submitted to your pharmacy. Please take as directed and contact our office if you believe you are having problem(s) with the medication(s).  Alternate between ibuprofen and tylenol for aches, pain and fever symptoms as discussed  Hydrate, rest and call if worse or unimproved  Work excuse note for Sunday through tomorrow as discussed  Upper Respiratory Infection, Adult An upper respiratory infection (URI) is also sometimes known as the common cold. The upper respiratory tract includes the nose, sinuses, throat, trachea, and bronchi. Bronchi are the airways leading to the lungs. Most people improve within 1 week, but symptoms can last up to 2 weeks. A residual cough may last even longer.  CAUSES Many different viruses can infect the tissues lining the upper respiratory tract. The tissues become irritated and inflamed and often become very moist. Mucus production is also common. A cold is contagious. You can easily spread the virus to others by oral contact. This includes kissing, sharing a glass, coughing, or sneezing. Touching your mouth or nose and then touching a surface, which is then touched by another person, can also spread the virus. SYMPTOMS  Symptoms typically develop 1 to 3 days after you come in contact with a cold virus. Symptoms vary from person to person. They may include:  Runny nose.  Sneezing.  Nasal congestion.  Sinus irritation.  Sore throat.  Loss of voice (laryngitis).  Cough.  Fatigue.  Muscle aches.  Loss of appetite.  Headache.  Low-grade fever. DIAGNOSIS  You might diagnose your own cold based on familiar symptoms, since most people get a cold 2 to 3 times a year. Your caregiver can confirm this  based on your exam. Most importantly, your caregiver can check that your symptoms are not due to another disease such as strep throat, sinusitis, pneumonia, asthma, or epiglottitis. Blood tests, throat tests, and X-rays are not necessary to diagnose a common cold, but they may sometimes be helpful in excluding other more serious diseases. Your caregiver will decide if any further tests are required. RISKS AND COMPLICATIONS  You may be at risk for a more severe case of the common cold if you smoke cigarettes, have chronic heart disease (such as heart failure) or lung disease (such as asthma), or if you have a weakened immune system. The very young and very old are also at risk for more serious infections. Bacterial sinusitis, middle ear infections, and bacterial pneumonia can complicate the common cold. The common cold can worsen asthma and chronic obstructive pulmonary disease (COPD). Sometimes, these complications can require emergency medical care and may be life-threatening. PREVENTION  The best way to protect against getting a cold is to practice good hygiene. Avoid oral or hand contact with people with cold symptoms. Wash your hands often if contact occurs. There is no clear evidence that vitamin C, vitamin E, echinacea, or exercise reduces the chance of developing a cold. However, it is always recommended to get plenty of rest and practice good nutrition. TREATMENT  Treatment is directed at relieving symptoms. There is no cure. Antibiotics are not effective, because the infection is caused by a virus, not by bacteria. Treatment may include:  Increased fluid intake. Sports drinks offer valuable electrolytes, sugars, and fluids.  Breathing heated mist or steam (  vaporizer or shower).  Eating chicken soup or other clear broths, and maintaining good nutrition.  Getting plenty of rest.  Using gargles or lozenges for comfort.  Controlling fevers with ibuprofen or acetaminophen as directed by your  caregiver.  Increasing usage of your inhaler if you have asthma. Zinc gel and zinc lozenges, taken in the first 24 hours of the common cold, can shorten the duration and lessen the severity of symptoms. Pain medicines may help with fever, muscle aches, and throat pain. A variety of non-prescription medicines are available to treat congestion and runny nose. Your caregiver can make recommendations and may suggest nasal or lung inhalers for other symptoms.  HOME CARE INSTRUCTIONS   Only take over-the-counter or prescription medicines for pain, discomfort, or fever as directed by your caregiver.  Use a warm mist humidifier or inhale steam from a shower to increase air moisture. This may keep secretions moist and make it easier to breathe.  Drink enough water and fluids to keep your urine clear or pale yellow.  Rest as needed.  Return to work when your temperature has returned to normal or as your caregiver advises. You may need to stay home longer to avoid infecting others. You can also use a face mask and careful hand washing to prevent spread of the virus. SEEK MEDICAL CARE IF:   After the first few days, you feel you are getting worse rather than better.  You need your caregiver's advice about medicines to control symptoms.  You develop chills, worsening shortness of breath, or brown or red sputum. These may be signs of pneumonia.  You develop yellow or brown nasal discharge or pain in the face, especially when you bend forward. These may be signs of sinusitis.  You develop a fever, swollen neck glands, pain with swallowing, or white areas in the back of your throat. These may be signs of strep throat. SEEK IMMEDIATE MEDICAL CARE IF:   You have a fever.  You develop severe or persistent headache, ear pain, sinus pain, or chest pain.  You develop wheezing, a prolonged cough, cough up blood, or have a change in your usual mucus (if you have chronic lung disease).  You develop sore  muscles or a stiff neck. Document Released: 02/15/2001 Document Revised: 11/14/2011 Document Reviewed: 12/24/2010 [facility] Patient Information 2014 [place], [place].

## 2013-09-10 NOTE — Progress Notes (Signed)
Pre-visit discussion using our clinic review tool. No additional management support is needed unless otherwise documented below in the visit note.  

## 2013-09-10 NOTE — Progress Notes (Signed)
  Subjective:   HPI  complains of flu-like symptoms  Onset 72 h ago, progressively worse symptoms  associated with fever, headache, myalgia and exhaustion Also mild nasal congestion, sneezing, sore throat, and cough Minimal relief with OTC meds Precipitated by sick contacts  Past Medical History  Diagnosis Date  . Depression   . Insomnia 10/09/2006  . Anemia 10/09/2006  . Menorrhagia 08/18/2009  . 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 Nausea 10/09/2006  . Itchy eyes 01/06/2006    Review of Systems Constitutional: No unexpected weight change Pulmonary: No pleurisy or hemoptysis Cardiovascular: No chest pain or palpitations     Objective:   Physical Exam BP 126/84  Pulse 87  Temp(Src) 99.5 F (37.5 C) (Oral)  Wt 210 lb (95.255 kg)  SpO2 99% GEN: mildly ill appearing, fatigued HENT: NCAT, no sinus tenderness bilaterally, nares with clear discharge, oropharynx mild erythema, no exudate Eyes: Vision grossly intact, no conjunctivitis Neck: shoddy anterior LAD, supple with FROM Lungs: Clear to auscultation without rhonchi or wheeze, no increased work of breathing Cardiovascular: Regular rate and rhythm, no bilateral edema  Lab Results  Component Value Date   WBC 8.1 01/14/2013   HGB 9.0* 01/14/2013   HCT 27.5* 01/14/2013   PLT 293.0 01/14/2013   GLUCOSE 92 01/14/2013   CHOL 131 01/14/2013   TRIG 90.0 01/14/2013   HDL 46.70 01/14/2013   LDLCALC 66 01/14/2013   NA 138 01/14/2013   K 3.2* 01/14/2013   CL 109 01/14/2013   CREATININE 0.6 01/14/2013   BUN 10 01/14/2013   CO2 24 01/14/2013   TSH 2.52 01/14/2013   HGBA1C 5.6 01/14/2013      Assessment & Plan:   Viral URI - symptoms clinically consistent with influenza    Explained lack of efficacy for traditional antibiotics in viral disease  beyond window for Tamiflu (not prescribed) symptomatic care with prescription cough and decongestion - new prescriptions done  Symptomatic care with Tylenol and/or Advil, hydration and rest  - also OTC antihistamines, and/or antitussives and salt gargle advised as needed  Pt education provided Work/school excuse note provided

## 2013-09-13 ENCOUNTER — Telehealth: Payer: Self-pay

## 2013-09-13 MED ORDER — AZITHROMYCIN 250 MG PO TABS: ORAL_TABLET | ORAL | Status: DC

## 2013-09-13 NOTE — Telephone Encounter (Signed)
The patient called and stated she was suppose to have medicine for a sinus infection called in for her yesterday.  She stated she went by the pharmacy, but they had not received the rx yet.  Thanks!

## 2013-09-13 NOTE — Telephone Encounter (Signed)
Called pt back inform her md only rx the cough syrup at that time. Pt states she would like an antibiotic call in. Cough is not getting any better. She is coughing up phlegm...Amanda [name]

## 2013-09-13 NOTE — Telephone Encounter (Signed)
Zpak - erx done

## 2013-09-14 NOTE — Telephone Encounter (Signed)
Notified pt md sent z-pack...lmb

## 2014-01-06 ENCOUNTER — Encounter: Payer: 59 | Admitting: Internal Medicine

## 2014-01-14 ENCOUNTER — Ambulatory Visit (INDEPENDENT_AMBULATORY_CARE_PROVIDER_SITE_OTHER): Payer: 59 | Admitting: Internal Medicine

## 2014-01-14 ENCOUNTER — Encounter: Payer: Self-pay | Admitting: Internal Medicine

## 2014-01-14 VITALS — BP 118/82 | HR 68 | Temp 98.0°F | Ht 67.25 in | Wt 211.0 lb

## 2014-01-14 DIAGNOSIS — Z111 Encounter for screening for respiratory tuberculosis: Secondary | ICD-10-CM

## 2014-01-14 DIAGNOSIS — Z0000 Encounter for general adult medical examination without abnormal findings: Secondary | ICD-10-CM

## 2014-01-14 NOTE — Progress Notes (Signed)
Pre visit review using our clinic review tool, if applicable. No additional management support is needed unless otherwise documented below in the visit note. 

## 2014-01-14 NOTE — Patient Instructions (Addendum)

## 2014-01-14 NOTE — Progress Notes (Signed)
Subjective:    Patient ID: Amanda [name], female    DOB: 01/05/1976, 38 y.o.   MRN: [id number]  HPI  Pt presents to the clinic today for her annual exam. She has no concerns today.   Flu: 06/2013 Tetanus: 2011 LMP: 01/13/2014 Pap Smear: 09/06/2011 Dentist: [email or web address]  Review of Systems      Past Medical History  Diagnosis Date  . Depression   . Insomnia 10/09/2006  . Anemia 10/09/2006  . Menorrhagia 08/18/2009  . 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 Nausea 10/09/2006  . Itchy eyes 01/06/2006    Current Outpatient Prescriptions  Medication Sig Dispense Refill  . acetaminophen (TYLENOL) 325 MG tablet Take 650 mg by mouth every 6 (six) hours as needed for pain.      . Omega-3 Fatty Acids (FISH OIL) 1200 MG CAPS Take by mouth daily.       No current facility-administered medications for this visit.    No Known Allergies  Family History  Problem Relation Age of Onset  . Hypertension Mother   . Cancer Neg Hx     History   Social History  . Marital Status: Married    Spouse Name: N/A    Number of Children: N/A  . Years of Education: N/A   Occupational History  . Not on file.   Social History Main Topics  . Smoking status: Never Smoker   . Smokeless tobacco: Never Used  . Alcohol Use: No  . Drug Use: No  . Sexual Activity: Yes   Other Topics Concern  . Not on file   Social History Narrative  . No narrative on file     Constitutional: Denies fever, malaise, fatigue, headache or abrupt weight changes.  HEENT: Denies eye pain, eye redness, ear pain, ringing in the ears, wax buildup, runny nose, nasal congestion, bloody nose, or sore throat. Respiratory: Denies difficulty breathing, shortness of breath, cough or sputum production.   Cardiovascular: Denies chest pain, chest tightness, palpitations or swelling in the hands or feet.  Gastrointestinal: Denies abdominal pain, bloating, constipation, diarrhea or blood in the stool.  GU: Denies urgency, frequency,  pain with urination, burning sensation, blood in urine, odor or discharge. Musculoskeletal: Denies decrease in range of motion, difficulty with gait, muscle pain or joint pain and swelling.  Skin: Denies redness, rashes, lesions or ulcercations.  Neurological: Denies dizziness, difficulty with memory, difficulty with speech or problems with balance and coordination.   No other specific complaints in a complete review of systems (except as listed in HPI above).  Objective:   Physical Exam   BP 118/82  Pulse 68  Temp(Src) 98 F (36.7 C) (Oral)  Ht 5' 7.25" (1.708 m)  Wt 211 lb (95.709 kg)  BMI 32.81 kg/m2  LMP 01/09/2014 Wt Readings from Last 3 Encounters:  01/14/14 211 lb (95.709 kg)  09/10/13 210 lb (95.255 kg)  03/19/13 204 lb (92.534 kg)    General: Appears her stated age, well developed, well nourished in NAD. Skin: Warm, dry and intact. No rashes, lesions or ulcerations noted. HEENT: Head: normal shape and size; Eyes: sclera white, no icterus, conjunctiva pink, PERRLA and EOMs intact; Ears: Tm's gray and intact, normal light reflex; Nose: mucosa pink and moist, septum midline; Throat/Mouth: Teeth present, mucosa pink and moist, no exudate, lesions or ulcerations noted.  Neck: Normal range of motion. Neck supple, trachea midline. No massses, lumps or thyromegaly present.  Cardiovascular: Normal rate and rhythm. S1,S2 noted.  No murmur, rubs or gallops noted.  No JVD or BLE edema. No carotid bruits noted. Pulmonary/Chest: Normal effort and positive vesicular breath sounds. No respiratory distress. No wheezes, rales or ronchi noted.  Abdomen: Soft and nontender. Normal bowel sounds, no bruits noted. No distention or masses noted. Liver, spleen and kidneys non palpable. Musculoskeletal: Normal range of motion. No signs of joint swelling. No difficulty with gait.  Neurological: Alert and oriented. Cranial nerves II-XII intact. Coordination normal. +DTRs bilaterally. Psychiatric: Mood  and affect normal. Behavior is normal. Judgment and thought content normal.     BMET    Component Value Date/Time   NA 138 01/14/2013 1538   K 3.2* 01/14/2013 1538   CL 109 01/14/2013 1538   CO2 24 01/14/2013 1538   GLUCOSE 92 01/14/2013 1538   BUN 10 01/14/2013 1538   CREATININE 0.6 01/14/2013 1538   CALCIUM 9.1 01/14/2013 1538    Lipid Panel     Component Value Date/Time   CHOL 131 01/14/2013 1538   TRIG 90.0 01/14/2013 1538   HDL 46.70 01/14/2013 1538   CHOLHDL 3 01/14/2013 1538   VLDL 18.0 01/14/2013 1538   LDLCALC 66 01/14/2013 1538    CBC    Component Value Date/Time   WBC 8.1 01/14/2013 1538   RBC 4.23 01/14/2013 1538   HGB 9.0* 01/14/2013 1538   HCT 27.5* 01/14/2013 1538   PLT 293.0 01/14/2013 1538   MCV 65.0 Repeated and verified X2.* 01/14/2013 1538   MCHC 32.6 01/14/2013 1538   RDW 17.0* 01/14/2013 1538    Hgb A1C Lab Results  Component Value Date   HGBA1C 5.6 01/14/2013        Assessment & Plan:   Preventative Health Maintenance:  Encouraged her to work on diet and exercise Will obtain screening labs today Form filled out for school per request  RTC in 1 year for annual exam/pap smear

## 2014-01-22 ENCOUNTER — Telehealth: Payer: Self-pay

## 2014-01-22 NOTE — Telephone Encounter (Signed)
Left detailed msg on VM letting pt know there is some results from the lab needed to finish filling her form out for school--can she make a lab appt to have this done or i can proceed with results from last year--not sure if that is acceptable

## 2014-01-24 ENCOUNTER — Other Ambulatory Visit: Payer: Self-pay | Admitting: Internal Medicine

## 2014-01-24 DIAGNOSIS — Z0000 Encounter for general adult medical examination without abnormal findings: Secondary | ICD-10-CM

## 2014-01-24 DIAGNOSIS — Z112 Encounter for screening for other bacterial diseases: Secondary | ICD-10-CM

## 2014-01-24 NOTE — Telephone Encounter (Signed)
Pt has lab appt Tues for repeat labs because the previous labs were not picked up

## 2014-01-28 ENCOUNTER — Other Ambulatory Visit (INDEPENDENT_AMBULATORY_CARE_PROVIDER_SITE_OTHER): Payer: 59

## 2014-01-28 DIAGNOSIS — Z0000 Encounter for general adult medical examination without abnormal findings: Secondary | ICD-10-CM

## 2014-01-28 NOTE — Telephone Encounter (Signed)
Pt did come in to have her labs redrawn and picked up paper work for school

## 2014-01-29 LAB — CBC WITH DIFFERENTIAL/PLATELET
Basophils Absolute: 0 10*3/uL (ref 0.0–0.2)
Basos: 0 %
Eos: 2 %
Eosinophils Absolute: 0.1 10*3/uL (ref 0.0–0.4)
HCT: 33.5 % — ABNORMAL LOW (ref 34.0–46.6)
Hemoglobin: 10.2 g/dL — ABNORMAL LOW (ref 11.1–15.9)
Immature Grans (Abs): 0 10*3/uL (ref 0.0–0.1)
Immature Granulocytes: 0 %
Lymphocytes Absolute: 2.2 10*3/uL (ref 0.7–3.1)
Lymphs: 41 %
MCH: 21.3 pg — ABNORMAL LOW (ref 26.6–33.0)
MCHC: 30.4 g/dL — ABNORMAL LOW (ref 31.5–35.7)
MCV: 70 fL — ABNORMAL LOW (ref 79–97)
Monocytes Absolute: 0.4 10*3/uL (ref 0.1–0.9)
Monocytes: 7 %
Neutrophils Absolute: 2.7 10*3/uL (ref 1.4–7.0)
Neutrophils Relative %: 50 %
RBC: 4.8 x10E6/uL (ref 3.77–5.28)
RDW: 17.1 % — ABNORMAL HIGH (ref 12.3–15.4)
WBC: 5.4 10*3/uL (ref 3.4–10.8)

## 2014-01-29 LAB — COMPREHENSIVE METABOLIC PANEL
ALT: 12 IU/L (ref 0–32)
AST: 16 IU/L (ref 0–40)
Albumin/Globulin Ratio: 1.4 (ref 1.1–2.5)
Albumin: 3.9 g/dL (ref 3.5–5.5)
Alkaline Phosphatase: 63 IU/L (ref 39–117)
BUN/Creatinine Ratio: 14 (ref 8–20)
BUN: 10 mg/dL (ref 6–20)
CO2: 17 mmol/L — ABNORMAL LOW (ref 18–29)
Calcium: 9.3 mg/dL (ref 8.7–10.2)
Chloride: 110 mmol/L — ABNORMAL HIGH (ref 97–108)
Creatinine, Ser: 0.73 mg/dL (ref 0.57–1.00)
GFR calc Af Amer: 122 mL/min/{1.73_m2} (ref >59)
GFR calc non Af Amer: 106 mL/min/{1.73_m2} (ref >59)
Globulin, Total: 2.7 g/dL (ref 1.5–4.5)
Glucose: 92 mg/dL (ref 65–99)
Potassium: 4.7 mmol/L (ref 3.5–5.2)
Sodium: 143 mmol/L (ref 134–144)
Total Bilirubin: 0.2 mg/dL (ref 0.0–1.2)
Total Protein: 6.6 g/dL (ref 6.0–8.5)

## 2014-01-29 LAB — LIPID PANEL
Chol/HDL Ratio: 2.9 ratio units (ref 0.0–4.4)
Cholesterol, Total: 155 mg/dL (ref 100–199)
HDL: 54 mg/dL (ref >39)
LDL Calculated: 90 mg/dL (ref 0–99)
Triglycerides: 54 mg/dL (ref 0–149)
VLDL Cholesterol Cal: 11 mg/dL (ref 5–40)

## 2014-01-29 LAB — TSH: TSH: 1.95 u[IU]/mL (ref 0.450–4.500)

## 2014-01-30 LAB — QUANTIFERON IN TUBE
QFT TB AG MINUS NIL VALUE: 0.01 IU/mL
QUANTIFERON MITOGEN VALUE: 6.3 IU/mL
QUANTIFERON TB AG VALUE: 0.02 IU/mL
QUANTIFERON TB GOLD: NEGATIVE
Quantiferon Nil Value: 0.01 IU/mL

## 2014-01-30 LAB — QUANTIFERON TB GOLD ASSAY (BLOOD)

## 2014-06-03 ENCOUNTER — Encounter: Payer: Self-pay | Admitting: Internal Medicine

## 2014-06-03 ENCOUNTER — Ambulatory Visit (INDEPENDENT_AMBULATORY_CARE_PROVIDER_SITE_OTHER): Payer: 59 | Admitting: Internal Medicine

## 2014-06-03 ENCOUNTER — Encounter (INDEPENDENT_AMBULATORY_CARE_PROVIDER_SITE_OTHER): Payer: Self-pay

## 2014-06-03 VITALS — BP 116/74 | HR 74 | Temp 98.8°F | Wt 217.0 lb

## 2014-06-03 DIAGNOSIS — S46819A Strain of other muscles, fascia and tendons at shoulder and upper arm level, unspecified arm, initial encounter: Secondary | ICD-10-CM

## 2014-06-03 DIAGNOSIS — S46211A Strain of muscle, fascia and tendon of other parts of biceps, right arm, initial encounter: Secondary | ICD-10-CM

## 2014-06-03 DIAGNOSIS — S43499A Other sprain of unspecified shoulder joint, initial encounter: Secondary | ICD-10-CM

## 2014-06-03 DIAGNOSIS — R7303 Prediabetes: Secondary | ICD-10-CM

## 2014-06-03 DIAGNOSIS — R7309 Other abnormal glucose: Secondary | ICD-10-CM

## 2014-06-03 MED ORDER — TRAMADOL HCL 50 MG PO TABS: 50.0000 mg | ORAL_TABLET | Freq: Three times a day (TID) | ORAL | Status: DC | PRN

## 2014-06-03 NOTE — Progress Notes (Signed)
Subjective:    Patient ID: Amanda [name], female    DOB: 1975/09/27, 38 y.o.   MRN: [id number]  HPI  Pt presents to the clinic today to follow up A1C. She reports that she went to her gyn who took blood. Her A1C was 5.8%. t was 5.6% last time it was checked here.  Additionally, she c/o right arm pain. This started 2 weeks ago. The pain is in her bicep. She reports it feels like a constant ache, worse with movement. She did go to UC for the same. She went to grab a rail while she was slipping down some steps. She reports that her arm did twist. She has been taking Ibuprofen but it has helped.  Review of Systems      Past Medical History  Diagnosis Date  . Depression   . Insomnia 10/09/2006  . Anemia 10/09/2006  . Menorrhagia 08/18/2009  . 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 Nausea 10/09/2006  . Itchy eyes 01/06/2006    Current Outpatient Prescriptions  Medication Sig Dispense Refill  . acetaminophen (TYLENOL) 325 MG tablet Take 650 mg by mouth every 6 (six) hours as needed for pain.      . Omega-3 Fatty Acids (FISH OIL) 1200 MG CAPS Take by mouth daily.       No current facility-administered medications for this visit.    No Known Allergies  Family History  Problem Relation Age of Onset  . Hypertension Mother   . Cancer Neg Hx     History   Social History  . Marital Status: Married    Spouse Name: N/A    Number of Children: N/A  . Years of Education: N/A   Occupational History  . Not on file.   Social History Main Topics  . Smoking status: Never Smoker   . Smokeless tobacco: Never Used  . Alcohol Use: Yes     Comment: occasional  . Drug Use: No  . Sexual Activity: Yes   Other Topics Concern  . Not on file   Social History Narrative  . No narrative on file     Constitutional: Denies fever, malaise, fatigue, headache or abrupt weight changes.  HEENT: Denies eye pain, eye redness, ear pain, ringing in the ears, wax buildup, runny nose, nasal congestion,  bloody nose, or sore throat. Respiratory: Denies difficulty breathing, shortness of breath, cough or sputum production.   Cardiovascular: Denies chest pain, chest tightness, palpitations or swelling in the hands or feet.  Gastrointestinal: Denies abdominal pain, bloating, constipation, diarrhea or blood in the stool.  GU: Denies urgency, frequency, pain with urination, burning sensation, blood in urine, odor or discharge. Musculoskeletal: Pt reports right arm pain. Denies decrease in range of motion, difficulty with gait, or joint pain and swelling.    No other specific complaints in a complete review of systems (except as listed in HPI above).  Objective:   Physical Exam  BP 116/74  Pulse 74  Temp(Src) 98.8 F (37.1 C) (Oral)  Wt 217 lb (98.431 kg)  SpO2 99% Wt Readings from Last 3 Encounters:  06/03/14 217 lb (98.431 kg)  01/14/14 211 lb (95.709 kg)  09/10/13 210 lb (95.255 kg)    General: Appears her stated age, well developed, well nourished in NAD. Cardiovascular: Normal rate and rhythm. S1,S2 noted.  No murmur, rubs or gallops noted. Pulmonary/Chest: Normal effort and positive vesicular breath sounds. No respiratory distress. No wheezes, rales or ronchi noted.  Musculoskeletal: Normal flexion and extension of the right  elbow. No pain with palpation of the shoulder or bicep. Strength 5/5 BUE.   BMET    Component Value Date/Time   NA 143 01/28/2014 0854   NA 138 01/14/2013 1538   K 4.7 01/28/2014 0854   CL 110* 01/28/2014 0854   CO2 17* 01/28/2014 0854   GLUCOSE 92 01/28/2014 0854   GLUCOSE 92 01/14/2013 1538   BUN 10 01/28/2014 0854   BUN 10 01/14/2013 1538   CREATININE 0.73 01/28/2014 0854   CALCIUM 9.3 01/28/2014 0854   GFRNONAA 106 01/28/2014 0854   GFRAA 122 01/28/2014 0854    Lipid Panel     Component Value Date/Time   CHOL 131 01/14/2013 1538   TRIG 54 01/28/2014 0854   HDL 54 01/28/2014 0854   HDL 46.70 01/14/2013 1538   CHOLHDL 2.9 01/28/2014 0854   CHOLHDL 3  01/14/2013 1538   VLDL 18.0 01/14/2013 1538   LDLCALC 90 01/28/2014 0854   LDLCALC 66 01/14/2013 1538    CBC    Component Value Date/Time   WBC 5.4 01/28/2014 0854   WBC 8.1 01/14/2013 1538   RBC 4.80 01/28/2014 0854   RBC 4.23 01/14/2013 1538   HGB 10.2* 01/28/2014 0854   HCT 33.5* 01/28/2014 0854   PLT 293.0 01/14/2013 1538   MCV 70* 01/28/2014 0854   MCH 21.3* 01/28/2014 0854   MCHC 30.4* 01/28/2014 0854   MCHC 32.6 01/14/2013 1538   RDW 17.1* 01/28/2014 0854   RDW 17.0* 01/14/2013 1538   LYMPHSABS 2.2 01/28/2014 0854   EOSABS 0.1 01/28/2014 0854   BASOSABS 0.0 01/28/2014 0854    Hgb A1C Lab Results  Component Value Date   HGBA1C 5.6 01/14/2013         Assessment & Plan:   Prediabetes:  Discussed low carb diet and importance of aerobic exercise Handout given on diabetic diet. Will check A1C yearly  Right bicep strain:  Stretching exercises given rx for tramadol for severe pain  RTC as needed or if symptoms persist or worsen

## 2014-06-03 NOTE — Patient Instructions (Addendum)
Diabetes Mellitus and Food It is important for you to manage your blood sugar (glucose) level. Your blood glucose level can be greatly affected by what you eat. Eating healthier foods in the appropriate amounts throughout the day at about the same time each day will help you control your blood glucose level. It can also help slow or prevent worsening of your diabetes mellitus. Healthy eating may even help you improve the level of your blood pressure and reach or maintain a healthy weight.  HOW CAN FOOD AFFECT ME? Carbohydrates Carbohydrates affect your blood glucose level more than any other type of food. Your dietitian will help you determine how many carbohydrates to eat at each meal and teach you how to count carbohydrates. Counting carbohydrates is important to keep your blood glucose at a healthy level, especially if you are using insulin or taking certain medicines for diabetes mellitus. Alcohol Alcohol can cause sudden decreases in blood glucose (hypoglycemia), especially if you use insulin or take certain medicines for diabetes mellitus. Hypoglycemia can be a life-threatening condition. Symptoms of hypoglycemia (sleepiness, dizziness, and disorientation) are similar to symptoms of having too much alcohol.  If your health care provider has given you approval to drink alcohol, do so in moderation and use the following guidelines:  Women should not have more than one drink per day, and men should not have more than two drinks per day. One drink is equal to:  12 oz of beer.  5 oz of wine.  1 oz of hard liquor.  Do not drink on an empty stomach.  Keep yourself hydrated. Have water, diet soda, or unsweetened iced tea.  Regular soda, juice, and other mixers might contain a lot of carbohydrates and should be counted. WHAT FOODS ARE NOT RECOMMENDED? As you make food choices, it is important to remember that all foods are not the same. Some foods have fewer nutrients per serving than other  foods, even though they might have the same number of calories or carbohydrates. It is difficult to get your body what it needs when you eat foods with fewer nutrients. Examples of foods that you should avoid that are high in calories and carbohydrates but low in nutrients include:  Trans fats (most processed foods list trans fats on the Nutrition Facts label).  Regular soda.  Juice.  Candy.  Sweets, such as cake, pie, doughnuts, and cookies.  Fried foods. WHAT FOODS CAN I EAT? Have nutrient-rich foods, which will nourish your body and keep you healthy. The food you should eat also will depend on several factors, including:  The calories you need.  The medicines you take.  Your weight.  Your blood glucose level.  Your blood pressure level.  Your cholesterol level. You also should eat a variety of foods, including:  Protein, such as meat, poultry, fish, tofu, nuts, and seeds (lean animal proteins are best).  Fruits.  Vegetables.  Dairy products, such as milk, cheese, and yogurt (low fat is best).  Breads, grains, pasta, cereal, rice, and beans.  Fats such as olive oil, trans fat-free margarine, canola oil, avocado, and olives. DOES EVERYONE WITH DIABETES MELLITUS HAVE THE SAME MEAL PLAN? Because every person with diabetes mellitus is different, there is not one meal plan that works for everyone. It is very important that you meet with a dietitian who will help you create a meal plan that is just right for you. Document Released: 05/19/2005 Document Revised: 08/27/2013 Document Reviewed: 07/19/2013 [employer] Patient Information 2015 [employer]. This   information is not intended to replace advice given to you by your health care provider. Make sure you discuss any questions you have with your health care provider.  

## 2015-01-20 ENCOUNTER — Ambulatory Visit (INDEPENDENT_AMBULATORY_CARE_PROVIDER_SITE_OTHER): Payer: 59 | Admitting: Internal Medicine

## 2015-01-20 ENCOUNTER — Encounter: Payer: Self-pay | Admitting: Internal Medicine

## 2015-01-20 ENCOUNTER — Ambulatory Visit: Payer: Self-pay | Admitting: Internal Medicine

## 2015-01-20 VITALS — BP 132/82 | HR 88 | Temp 98.4°F | Wt 220.0 lb

## 2015-01-20 DIAGNOSIS — R05 Cough: Secondary | ICD-10-CM

## 2015-01-20 DIAGNOSIS — R0982 Postnasal drip: Secondary | ICD-10-CM | POA: Diagnosis not present

## 2015-01-20 DIAGNOSIS — J309 Allergic rhinitis, unspecified: Secondary | ICD-10-CM

## 2015-01-20 DIAGNOSIS — R059 Cough, unspecified: Secondary | ICD-10-CM

## 2015-01-20 MED ORDER — BENZONATATE 100 MG PO CAPS: 100.0000 mg | ORAL_CAPSULE | Freq: Three times a day (TID) | ORAL | Status: DC | PRN

## 2015-01-20 NOTE — Progress Notes (Signed)
HPI  Pt presents to the clinic today with c/o sore throat and cough. She reports this started 3 weeks ago. She did go to UC for the same. She was given Cefdinir. She did complete the course of antibiotics and did notice some improvement but the cough continues. It is productive at times with yellow mucous. She denies fever, chills, or shortness of breath. She is taking Zyrtec as needed. She has no history of allergies or breathing problems. She has had sick contacts.  Review of Systems      Past Medical History  Diagnosis Date  . Depression   . Insomnia 10/09/2006  . Anemia 10/09/2006  . Menorrhagia 08/18/2009  . 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 Nausea 10/09/2006  . Itchy eyes 01/06/2006    Family History  Problem Relation Age of Onset  . Hypertension Mother   . Cancer Neg Hx     History   Social History  . Marital Status: Married    Spouse Name: N/A  . Number of Children: N/A  . Years of Education: N/A   Occupational History  . Not on file.   Social History Main Topics  . Smoking status: Never Smoker   . Smokeless tobacco: Never Used  . Alcohol Use: 0.0 oz/week    0 Standard drinks or equivalent per week     Comment: rare  . Drug Use: No  . Sexual Activity: Yes   Other Topics Concern  . Not on file   Social History Narrative    No Known Allergies   Constitutional: Denies headache, fatigue, fever or abrupt weight changes.  HEENT:  Positive sore throat. Denies eye redness, eye pain, pressure behind the eyes, facial pain, nasal congestion, ear pain, ringing in the ears, wax buildup, runny nose or bloody nose. Respiratory: Positive cough. Denies difficulty breathing or shortness of breath.  Cardiovascular: Denies chest pain, chest tightness, palpitations or swelling in the hands or feet.   No other specific complaints in a complete review of systems (except as listed in HPI above).  Objective:   BP 132/82 mmHg  Pulse 88  Temp(Src) 98.4 F (36.9 C) (Oral)   Wt 220 lb (99.791 kg)  SpO2 99%  LMP 01/19/2015  Wt Readings from Last 3 Encounters:  01/20/15 220 lb (99.791 kg)  06/03/14 217 lb (98.431 kg)  01/14/14 211 lb (95.709 kg)     General: Appears her stated age, well developed, well nourished in NAD. HEENT: Head: normal shape and size; Eyes: sclera white, no icterus, conjunctiva pink; Ears: Tm's gray and intact, normal light reflex; Nose: mucosa pink and moist, septum midline; Throat/Mouth: + PND. Teeth present, mucosa pink and moist, no exudate noted, no lesions or ulcerations noted.  Neck: No cervical lymphadenopathy.   Cardiovascular: Normal rate and rhythm. S1,S2 noted.  No murmur, rubs or gallops noted. Pulmonary/Chest: Normal effort and positive vesicular breath sounds. No respiratory distress. No wheezes, rales or ronchi noted.      Assessment & Plan:   Sore throat/cough secondary to post nasal drip/allergic rhinitis:  Get some rest and drink plenty of water Do salt water gargles for the sore throat Take the Zyrtec daily eRx for Tessalon pearls for cough  RTC as needed or if symptoms persist.

## 2015-01-20 NOTE — Progress Notes (Signed)
Pre visit review using our clinic review tool, if applicable. No additional management support is needed unless otherwise documented below in the visit note. 

## 2015-01-20 NOTE — Patient Instructions (Signed)

## 2015-05-28 ENCOUNTER — Encounter: Payer: Self-pay | Admitting: Internal Medicine

## 2015-05-28 ENCOUNTER — Ambulatory Visit (INDEPENDENT_AMBULATORY_CARE_PROVIDER_SITE_OTHER): Payer: 59 | Admitting: Internal Medicine

## 2015-05-28 VITALS — BP 130/76 | HR 73 | Temp 98.3°F | Wt 224.0 lb

## 2015-05-28 DIAGNOSIS — R609 Edema, unspecified: Secondary | ICD-10-CM | POA: Diagnosis not present

## 2015-05-28 DIAGNOSIS — M542 Cervicalgia: Secondary | ICD-10-CM | POA: Diagnosis not present

## 2015-05-28 MED ORDER — CYCLOBENZAPRINE HCL 5 MG PO TABS: 5.0000 mg | ORAL_TABLET | Freq: Every day | ORAL | Status: DC

## 2015-05-28 NOTE — Progress Notes (Signed)
Subjective:    Patient ID: Amanda [name], female    DOB: 03-25-1976, 39 y.o.   MRN: [id number]  HPI  Pt presents to the clinic today with c/o bilateral lower extremity leg swelling. This started 3-4 months ago. It does seem worse after sitting for long periods of time. She denies pain, numbness and tingling in the area. She denies any injury to the area. She has not tried anything OTC.  She also c/o left side neck pain. This started 2 weeks ago. The pain radiates into her left shoulder. She describes the pain as tightness. It seems worse if she turns her head or lifts her left arm above her head. She denies any numbness, tingling or weakness. She denies any injury to the area. She has tried Tylenol without any relief.  Review of Systems      Past Medical History  Diagnosis Date  . Depression   . Insomnia 10/09/2006  . Anemia 10/09/2006  . Menorrhagia 08/18/2009  . 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 Nausea 10/09/2006  . Itchy eyes 01/06/2006    Current Outpatient Prescriptions  Medication Sig Dispense Refill  . acetaminophen (TYLENOL) 325 MG tablet Take 650 mg by mouth every 6 (six) hours as needed for pain.    . Omega-3 Fatty Acids (FISH OIL) 1200 MG CAPS Take by mouth daily.     No current facility-administered medications for this visit.    No Known Allergies  Family History  Problem Relation Age of Onset  . Hypertension Mother   . Cancer Neg Hx     Social History   Social History  . Marital Status: Married    Spouse Name: N/A  . Number of Children: N/A  . Years of Education: N/A   Occupational History  . Not on file.   Social History Main Topics  . Smoking status: Never Smoker   . Smokeless tobacco: Never Used  . Alcohol Use: 0.0 oz/week    0 Standard drinks or equivalent per week     Comment: rare  . Drug Use: No  . Sexual Activity: Yes   Other Topics Concern  . Not on file   Social History Narrative     Constitutional: Denies fever, malaise,  fatigue, headache or abrupt weight changes.  Respiratory: Denies difficulty breathing, shortness of breath, cough or sputum production.   Cardiovascular: Pt reports swelling in her feet. Denies chest pain, chest tightness, palpitations or swelling in the hands.  Musculoskeletal: Pt reports neck pain. Denies decrease in range of motion, difficulty with gait,  or joint pain and swelling.  Skin: Denies redness, rashes, lesions or ulcercations.  Neurological: Denies dizziness, difficulty with memory, difficulty with speech or problems with balance and coordination.    No other specific complaints in a complete review of systems (except as listed in HPI above).  Objective:   Physical Exam   BP 130/76 mmHg  Pulse 73  Temp(Src) 98.3 F (36.8 C) (Oral)  Wt 224 lb (101.606 kg)  SpO2 98% Wt Readings from Last 3 Encounters:  05/28/15 224 lb (101.606 kg)  01/20/15 220 lb (99.791 kg)  06/03/14 217 lb (98.431 kg)    General: Appears her stated age, obese in NAD. Cardiovascular: Normal rate and rhythm. S1,S2 noted.  No murmur, rubs or gallops noted. No lower extremity edema noted. Pulmonary/Chest: Normal effort and positive vesicular breath sounds. No respiratory distress. No wheezes, rales or ronchi noted.  Musculoskeletal: Normal flexion, extension and rotation of the cervical spine. Pain with  rotation to the right. No localized tenderness noted over the cervical spine. Tension noted in left sternocleidomastoid.  Neurological: Alert and oriented.   BMET    Component Value Date/Time   NA 143 01/28/2014 0854   NA 138 01/14/2013 1538   K 4.7 01/28/2014 0854   CL 110* 01/28/2014 0854   CO2 17* 01/28/2014 0854   GLUCOSE 92 01/28/2014 0854   GLUCOSE 92 01/14/2013 1538   BUN 10 01/28/2014 0854   BUN 10 01/14/2013 1538   CREATININE 0.73 01/28/2014 0854   CALCIUM 9.3 01/28/2014 0854   GFRNONAA 106 01/28/2014 0854   GFRAA 122 01/28/2014 0854    Lipid Panel     Component Value Date/Time     CHOL 155 01/28/2014 0854   CHOL 131 01/14/2013 1538   TRIG 54 01/28/2014 0854   HDL 54 01/28/2014 0854   HDL 46.70 01/14/2013 1538   CHOLHDL 2.9 01/28/2014 0854   CHOLHDL 3 01/14/2013 1538   VLDL 18.0 01/14/2013 1538   LDLCALC 90 01/28/2014 0854   LDLCALC 66 01/14/2013 1538    CBC    Component Value Date/Time   WBC 5.4 01/28/2014 0854   WBC 8.1 01/14/2013 1538   RBC 4.80 01/28/2014 0854   RBC 4.23 01/14/2013 1538   HGB 10.2* 01/28/2014 0854   HCT 33.5* 01/28/2014 0854   PLT 293.0 01/14/2013 1538   MCV 70* 01/28/2014 0854   MCH 21.3* 01/28/2014 0854   MCHC 30.4* 01/28/2014 0854   MCHC 32.6 01/14/2013 1538   RDW 17.1* 01/28/2014 0854   RDW 17.0* 01/14/2013 1538   LYMPHSABS 2.2 01/28/2014 0854   EOSABS 0.1 01/28/2014 0854   BASOSABS 0.0 01/28/2014 0854    Hgb A1C Lab Results  Component Value Date   HGBA1C 5.6 01/14/2013        Assessment & Plan:   Edema in feet:  No edema noted on exam Advised her to keep her legs elevated If swelling persist, she should follow up  Neck pain:  MSK related Neck exercises given Take Ibuprofen instead of Tylenol eRx for Flexeril 5 mg QHS prn  RTC as needed or if symptoms persist or worsen

## 2015-05-28 NOTE — Progress Notes (Signed)
Pre visit review using our clinic review tool, if applicable. No additional management support is needed unless otherwise documented below in the visit note. 

## 2015-05-29 NOTE — Patient Instructions (Signed)

## 2015-06-03 ENCOUNTER — Telehealth: Payer: Self-pay | Admitting: Internal Medicine

## 2015-06-03 NOTE — Telephone Encounter (Signed)
Pt brought in Health Screening Result Appeal Form from [employer] that needs to be filled out. Best number to contact pt is [phone], Giving Regina ppw.

## 2015-06-04 NOTE — Telephone Encounter (Signed)
She needs an appt.

## 2015-06-05 NOTE — Telephone Encounter (Signed)
Left detailed msg on VM per HIPAA Pt needs to schedule annual physical to have form filled out

## 2015-06-24 ENCOUNTER — Encounter: Payer: 59 | Admitting: Internal Medicine

## 2015-06-26 ENCOUNTER — Encounter: Payer: 59 | Admitting: Internal Medicine

## 2015-08-11 LAB — HM PAP SMEAR

## 2015-08-12 ENCOUNTER — Encounter: Payer: Self-pay | Admitting: Internal Medicine

## 2016-06-04 ENCOUNTER — Ambulatory Visit (INDEPENDENT_AMBULATORY_CARE_PROVIDER_SITE_OTHER): Payer: 59 | Admitting: Family Medicine

## 2016-06-04 VITALS — BP 124/76 | HR 84 | Temp 98.4°F | Resp 16 | Wt 220.0 lb

## 2016-06-04 DIAGNOSIS — M7702 Medial epicondylitis, left elbow: Secondary | ICD-10-CM | POA: Diagnosis not present

## 2016-06-04 MED ORDER — DICLOFENAC SODIUM 1 % TD GEL: 2.0000 g | Freq: Four times a day (QID) | TRANSDERMAL | 5 refills | Status: DC

## 2016-06-04 NOTE — Progress Notes (Signed)
Dr. Frederico Hamman T. Jorryn Hershberger, MD, [facility] [place], [address] Phone: [phone] Fax: [phone]  06/04/2016  Patient: Amanda [name], MRN: [id number], DOB: 1976/09/01, 40 y.o.  Primary Physician:  Webb Silversmith, NP   Chief Complaint  Patient presents with  . Arm Pain    shooting pain from bicep to forearm xs 2 weeks, constant pain at night, hurts to use arm.    Subjective:   Sheliah Plane presents with lateral elbow pain.  Length of symptoms: 2 weeks Hand effected: L  Patient describes a dull ache on the medial elbow. There is some translation in the proximal forearm and in the distal upper arm. Pronation is painful. Patient points to the medial epicondyle as the point of maximal tenderness.  Medial forearm - achy and pain with something moving. No specific injury. Not too. Some neck pain - works at [employer]. No change in her arms and grip strength.   L medial epicondylitis.  No trauma.   No prior fractures or operative interventions in the effective hand. Prior PT or HEP: none  Denies numbness or tingling. No significant neck or shoulder pain.  The PMH, PSH, Social History, Family History, Medications, and allergies have been reviewed in [facility], and have been updated if relevant.  Patient Active Problem List   Diagnosis Date Noted  . Abdominal pain 04/04/2012  . Depression 11/15/2011    Past Medical History:  Diagnosis Date  . Anemia 10/09/2006  . Depression   . Insomnia 10/09/2006  . Itchy eyes 01/06/2006  . Menorrhagia 08/18/2009  . Nausea 10/09/2006  . 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    No past surgical history on file.  Social History   Social History  . Marital status: Married    Spouse name: N/A  . Number of children: N/A  . Years of education: N/A   Occupational History  . Not on file.   Social History Main Topics  . Smoking status: Never Smoker  . Smokeless tobacco: Never Used  .  Alcohol use 0.0 oz/week     Comment: rare  . Drug use: No  . Sexual activity: Yes   Other Topics Concern  . Not on file   Social History Narrative  . No narrative on file    Family History  Problem Relation Age of Onset  . Hypertension Mother   . Cancer Neg Hx     No Known Allergies  Medication list reviewed and updated in full in [place].  GEN: No fevers, chills. Nontoxic. Primarily MSK c/o today. MSK: Detailed in the HPI GI: tolerating PO intake without difficulty Neuro: No numbness, parasthesias, or tingling associated. Otherwise the pertinent positives of the ROS are noted above.   Objective:   Blood pressure 124/76, pulse 84, temperature 98.4 F (36.9 C), temperature source Oral, resp. rate 16, weight 220 lb (99.8 kg), SpO2 99 %.  GEN: Well-developed,well-nourished,in no acute distress; alert,appropriate and cooperative throughout examination HEENT: Normocephalic and atraumatic without obvious abnormalities. Ears, externally no deformities PULM: Breathing comfortably in no respiratory distress EXT: No clubbing, cyanosis, or edema PSYCH: Normally interactive. Cooperative during the interview. Pleasant. Friendly and conversant. Not anxious or depressed appearing. Normal, full affect.  L elbow Ecchymosis or edema: neg ROM: full flexion, extension, pronation, supination Shoulder ROM: Full Flexion: 5/5 Extension: 5/5, nt Supination: 5/5, nt Pronation: 5/5, tender Wrist ext: 5/5 Wrist flexion: 5/5 No gross bony abnormality Varus and Valgus stress: stable ECRB tenderness:  YES, TTP Medial epicondyle: ttp Lateral epicondyle,nt  PAINFUL grip: 5/5  sensation intact Tinel's, Elbow: negative  Subjective:   Medial epicondylitis, left  Elbow anatomy was reviewed, and tendinopathy was explained.  Pt. given a formal rehab program. Series of concentric and eccentric exercises should be done starting with no weight, work up to 1 lb, hammer, etc.  Formal  PT would be beneficial. Emphasized stretching an cross-friction massage  Follow-up: prn  New Prescriptions   DICLOFENAC SODIUM (VOLTAREN) 1 % GEL    Apply 2 g topically 4 (four) times daily.   Signed,  Maud Deed. Genevive Printup, MD   Patient's Medications  New Prescriptions   DICLOFENAC SODIUM (VOLTAREN) 1 % GEL    Apply 2 g topically 4 (four) times daily.  Previous Medications   ACETAMINOPHEN (TYLENOL) 325 MG TABLET    Take 650 mg by mouth every 6 (six) hours as needed for pain.   OMEGA-3 FATTY ACIDS (FISH OIL) 1200 MG CAPS    Take by mouth daily.  Modified Medications   No medications on file  Discontinued Medications   CYCLOBENZAPRINE (FLEXERIL) 5 MG TABLET    Take 1 tablet (5 mg total) by mouth at bedtime.

## 2016-06-04 NOTE — Progress Notes (Signed)
Pre visit review using our clinic review tool, if applicable. No additional management support is needed unless otherwise documented below in the visit note. 

## 2016-06-07 ENCOUNTER — Encounter: Payer: 59 | Admitting: Internal Medicine

## 2016-10-21 ENCOUNTER — Encounter: Payer: Self-pay | Admitting: Family Medicine

## 2016-10-21 ENCOUNTER — Ambulatory Visit (INDEPENDENT_AMBULATORY_CARE_PROVIDER_SITE_OTHER): Payer: 59 | Admitting: Family Medicine

## 2016-10-21 VITALS — BP 140/90 | HR 78 | Temp 98.5°F | Ht 67.0 in | Wt 216.5 lb

## 2016-10-21 DIAGNOSIS — M26629 Arthralgia of temporomandibular joint, unspecified side: Secondary | ICD-10-CM | POA: Insufficient documentation

## 2016-10-21 DIAGNOSIS — J029 Acute pharyngitis, unspecified: Secondary | ICD-10-CM | POA: Diagnosis not present

## 2016-10-21 DIAGNOSIS — M26621 Arthralgia of right temporomandibular joint: Secondary | ICD-10-CM | POA: Diagnosis not present

## 2016-10-21 DIAGNOSIS — M67472 Ganglion, left ankle and foot: Secondary | ICD-10-CM | POA: Diagnosis not present

## 2016-10-21 LAB — POCT RAPID STREP A (OFFICE): Rapid Strep A Screen: NEGATIVE

## 2016-10-21 MED ORDER — IBUPROFEN 800 MG PO TABS: 800.0000 mg | ORAL_TABLET | Freq: Three times a day (TID) | ORAL | 0 refills | Status: DC | PRN

## 2016-10-21 NOTE — Patient Instructions (Signed)
Stop at front desk for referral to foot doctor for cyst.  Start ibuporofen 800 mg every eight hours for pain in jaw.  Ice. Stop gum and tough foods. See below. Talk with dentist about whether grinding teeth.    Temporomandibular Joint Syndrome Temporomandibular joint (TMJ) syndrome is a condition that affects the joints between your jaw and your skull. The TMJs are located near your ears and allow your jaw to open and close. These joints and the nearby muscles are involved in all movements of the jaw. People with TMJ syndrome have pain in the area of these joints and muscles. Chewing, biting, or other movements of the jaw can be difficult or painful. TMJ syndrome can be caused by various things. In many cases, the condition is mild and goes away within a few weeks. For some people, the condition can become a long-term problem. What are the causes? Possible causes of TMJ syndrome include:  Grinding your teeth or clenching your jaw. Some people do this when they are under stress.  Arthritis.  Injury to the jaw.  Head or neck injury.  Teeth or dentures that are not aligned well. In some cases, the cause of TMJ syndrome may not be known. What are the signs or symptoms? The most common symptom is an aching pain on the side of the head in the area of the TMJ. Other symptoms may include:  Pain when moving your jaw, such as when chewing or biting.  Being unable to open your jaw all the way.  Making a clicking sound when you open your mouth.  Headache.  Earache.  Neck or shoulder pain. How is this diagnosed? Diagnosis can usually be made based on your symptoms, your medical history, and a physical exam. Your health care provider may check the range of motion of your jaw. Imaging tests, such as X-rays or an MRI, are sometimes done. You may need to see your dentist to determine if your teeth and jaw are lined up correctly. How is this treated? TMJ syndrome often goes away on its own. If  treatment is needed, the options may include:  Eating soft foods and applying ice or heat.  Medicines to relieve pain or inflammation.  Medicines to relax the muscles.  A splint, bite plate, or mouthpiece to prevent teeth grinding or jaw clenching.  Relaxation techniques or counseling to help reduce stress.  Transcutaneous electrical nerve stimulation (TENS). This helps to relieve pain by applying an electrical current through the skin.  Acupuncture. This is sometimes helpful to relieve pain.  Jaw surgery. This is rarely needed. Follow these instructions at home:  Take medicines only as directed by your health care provider.  Eat a soft diet if you are having trouble chewing.  Apply ice to the painful area.  Put ice in a plastic bag.  Place a towel between your skin and the bag.  Leave the ice on for 20 minutes, 2-3 times a day.  Apply a warm compress to the painful area as directed.  Massage your jaw area and perform any jaw stretching exercises as recommended by your health care provider.  If you were given a mouthpiece or bite plate, wear it as directed.  Avoid foods that require a lot of chewing. Do not chew gum.  Keep all follow-up visits as directed by your health care provider. This is important. Contact a health care provider if:  You are having trouble eating.  You have new or worsening symptoms. Get help right  away if:  Your jaw locks open or closed. This information is not intended to replace advice given to you by your health care provider. Make sure you discuss any questions you have with your health care provider. Document Released: 05/17/2001 Document Revised: 04/21/2016 Document Reviewed: 03/27/2014 Elsevier Interactive Patient Education  2017 [employer].

## 2016-10-21 NOTE — Progress Notes (Signed)
Pre visit review using our clinic review tool, if applicable. No additional management support is needed unless otherwise documented below in the visit note. 

## 2016-10-21 NOTE — Assessment & Plan Note (Signed)
Pain most consistent with TMJ pain. Pt is not an ideal historian given some language and health concept differences.  Treat with ibuprofen 800 mg every 8 hours for pain and inflammation. No chewing gum or tough foods. Talk with dentist about whether there is evidence of tooth grinding.

## 2016-10-21 NOTE — Assessment & Plan Note (Signed)
Cyst has not improved  In 6 months, increasing in size anfd bothersome to patient, sore with wearing shoes. Will  Refer to podiatrist for consideration of cyst removal.

## 2016-10-21 NOTE — Progress Notes (Signed)
   Subjective:    Patient ID: Amanda [name] [name], female    DOB: June 27, 1976, 41 y.o.   MRN: [id number]  HPI   41 year old female pt of  Webb Silversmith presents with new onset swelling on right dorsal foot. Has been present  6 months, increasing in size.   Close shoe make it uncomfortable.  No redness, no heat.  No injury.    2 days ago also noted. Right ear is sore and itchy in ear canal.  Feels itchy swelling area in posterior throat.  Feels like area in front on right ear is swollen.  Pain when opening jaw in throat.  No sneeze, eyes watery recently.   Tried ibuprofen for this.   Works as  Marine scientist.   Review of Systems  Constitutional: Negative for fatigue and fever.  HENT: Negative for ear pain.   Eyes: Negative for pain.  Respiratory: Negative for chest tightness and shortness of breath.   Cardiovascular: Negative for chest pain, palpitations and leg swelling.  Gastrointestinal: Negative for abdominal pain.  Genitourinary: Negative for dysuria.       Objective:   Physical Exam  Constitutional: Vital signs are normal. Amanda [name] appears well-developed and well-nourished. Amanda [name] is cooperative.  Non-toxic appearance. Amanda [name] does not appear ill. No distress.  HENT:  Head: Normocephalic.  Right Ear: Hearing, tympanic membrane, external ear and ear canal normal. Tympanic membrane is not erythematous, not retracted and not bulging. No middle ear effusion.  Left Ear: Hearing, tympanic membrane, external ear and ear canal normal. Tympanic membrane is not erythematous, not retracted and not bulging.  No middle ear effusion.  Nose: No mucosal edema or rhinorrhea. Right sinus exhibits no maxillary sinus tenderness and no frontal sinus tenderness. Left sinus exhibits no maxillary sinus tenderness and no frontal sinus tenderness.  Mouth/Throat: Uvula is midline, oropharynx is clear and moist and mucous membranes are normal. No oropharyngeal exudate, posterior oropharyngeal edema, posterior oropharyngeal  erythema or tonsillar abscesses.  Pain and click over tmj ... No swelling.  Eyes: Conjunctivae, EOM and lids are normal. Pupils are equal, round, and reactive to light. Lids are everted and swept, no foreign bodies found.  Neck: Trachea normal and normal range of motion. Neck supple. Carotid bruit is not present. No thyroid mass and no thyromegaly present.  Cardiovascular: Normal rate, regular rhythm, S1 normal, S2 normal, normal heart sounds, intact distal pulses and normal pulses.  Exam reveals no gallop and no friction rub.   No murmur heard. Pulmonary/Chest: Effort normal and breath sounds normal. No tachypnea. No respiratory distress. Amanda [name] has no decreased breath sounds. Amanda [name] has no wheezes. Amanda [name] has no rhonchi. Amanda [name] has no rales.  Abdominal: Soft. Normal appearance and bowel sounds are normal. There is no tenderness.  Lymphadenopathy:       Head (right side): No submental, no submandibular, no tonsillar, no preauricular, no posterior auricular and no occipital adenopathy present.       Head (left side): No submental, no submandibular, no tonsillar, no preauricular, no posterior auricular and no occipital adenopathy present.    Amanda [name] has no cervical adenopathy.  Neurological: Amanda [name] is alert.  Skin: Skin is warm, dry and intact. No rash noted.  Psychiatric: Her speech is normal and behavior is normal. Judgment and thought content normal. Her mood appears not anxious. Cognition and memory are normal. Amanda [name] does not exhibit a depressed mood.          Assessment & Plan:

## 2016-10-31 DIAGNOSIS — H524 Presbyopia: Secondary | ICD-10-CM | POA: Diagnosis not present

## 2016-10-31 DIAGNOSIS — H5203 Hypermetropia, bilateral: Secondary | ICD-10-CM | POA: Diagnosis not present

## 2016-11-01 ENCOUNTER — Ambulatory Visit: Payer: Self-pay | Admitting: Podiatry

## 2016-11-03 DIAGNOSIS — D500 Iron deficiency anemia secondary to blood loss (chronic): Secondary | ICD-10-CM | POA: Diagnosis not present

## 2016-11-03 DIAGNOSIS — N800 Endometriosis of uterus: Secondary | ICD-10-CM | POA: Diagnosis not present

## 2016-11-03 DIAGNOSIS — Z01411 Encounter for gynecological examination (general) (routine) with abnormal findings: Secondary | ICD-10-CM | POA: Diagnosis not present

## 2016-11-03 DIAGNOSIS — Z139 Encounter for screening, unspecified: Secondary | ICD-10-CM | POA: Diagnosis not present

## 2016-11-03 DIAGNOSIS — Z3040 Encounter for surveillance of contraceptives, unspecified: Secondary | ICD-10-CM | POA: Diagnosis not present

## 2016-11-03 DIAGNOSIS — D259 Leiomyoma of uterus, unspecified: Secondary | ICD-10-CM | POA: Diagnosis not present

## 2016-11-03 DIAGNOSIS — Z124 Encounter for screening for malignant neoplasm of cervix: Secondary | ICD-10-CM | POA: Diagnosis not present

## 2016-11-03 DIAGNOSIS — Z1231 Encounter for screening mammogram for malignant neoplasm of breast: Secondary | ICD-10-CM | POA: Diagnosis not present

## 2016-11-03 DIAGNOSIS — N920 Excessive and frequent menstruation with regular cycle: Secondary | ICD-10-CM | POA: Diagnosis not present

## 2016-11-03 LAB — HM MAMMOGRAPHY: HM Mammogram: NORMAL (ref 0–4)

## 2016-11-04 ENCOUNTER — Encounter: Payer: Self-pay | Admitting: Internal Medicine

## 2016-11-06 LAB — HM PAP SMEAR: HM Pap smear: NORMAL

## 2016-11-09 ENCOUNTER — Encounter: Payer: Self-pay | Admitting: Podiatry

## 2016-11-09 ENCOUNTER — Ambulatory Visit (INDEPENDENT_AMBULATORY_CARE_PROVIDER_SITE_OTHER): Payer: 59

## 2016-11-09 ENCOUNTER — Other Ambulatory Visit: Payer: Self-pay | Admitting: Obstetrics and Gynecology

## 2016-11-09 ENCOUNTER — Ambulatory Visit (INDEPENDENT_AMBULATORY_CARE_PROVIDER_SITE_OTHER): Payer: 59 | Admitting: Podiatry

## 2016-11-09 DIAGNOSIS — M67472 Ganglion, left ankle and foot: Secondary | ICD-10-CM

## 2016-11-09 DIAGNOSIS — R52 Pain, unspecified: Secondary | ICD-10-CM

## 2016-11-09 DIAGNOSIS — R600 Localized edema: Secondary | ICD-10-CM

## 2016-11-09 DIAGNOSIS — M7752 Other enthesopathy of left foot: Secondary | ICD-10-CM

## 2016-11-09 DIAGNOSIS — R928 Other abnormal and inconclusive findings on diagnostic imaging of breast: Secondary | ICD-10-CM

## 2016-11-14 ENCOUNTER — Other Ambulatory Visit: Payer: 59

## 2016-11-15 MED ORDER — BETAMETHASONE SOD PHOS & ACET 6 (3-3) MG/ML IJ SUSP: 3.0000 mg | Freq: Once | INTRAMUSCULAR | Status: DC

## 2016-11-15 NOTE — Progress Notes (Signed)
   Subjective:  Patient presents today as a new patient for evaluation of a lump that is developing over the dorsal aspect of the left foot. It is slowly been increasing in size for the past 6 months. Patient states that there is some pain with pressure especially closed toe shoes. Patient has been using warm water soaks and massage to alleviate symptoms. Patient denies trauma Patient is a Marine scientist at [employer]    Objective/Physical Exam General: The patient is alert and oriented x3 in no acute distress.  Dermatology: Skin is warm, dry and supple bilateral lower extremities. Negative for open lesions or macerations.  Vascular: Palpable pedal pulses bilaterally. No edema or erythema noted. Capillary refill within normal limits.  Neurological: Epicritic and protective threshold grossly intact bilaterally.   Musculoskeletal Exam: There is a palpable fluctuant mass noted to the dorsum of the left foot. Minimal pain on palpation noted.  Assessment: #1 ganglion cyst left foot   Plan of Care:  #1 Patient was evaluated. #2 direct pressure was applied to the ganglion cyst to see if it would burst and be resorbed by the body. I was unable to rupture the wall of the ganglion cyst. #3 injection of 0.5 mL Celestone Soluspan injected into the ganglion cyst left foot. #4 compressive dressing was applied #5 prescription for compression hose were provided for the patient #6 return to clinic in 4 weeks. If the ganglion cyst is not any better we may proceed with surgical intervention to remove the ganglion cyst.   Edrick Kins, DPM [facility]  Dr. Edrick Kins, [address]                                        [place], [place] [address]                Office [phone]  Fax [phone]

## 2016-11-16 ENCOUNTER — Ambulatory Visit
Admission: RE | Admit: 2016-11-16 | Discharge: 2016-11-16 | Disposition: A | Discharge status: Home / Self Care | Payer: 59 | Source: Ambulatory Visit | Attending: Obstetrics and Gynecology | Admitting: Obstetrics and Gynecology

## 2016-11-16 ENCOUNTER — Other Ambulatory Visit: Payer: Self-pay | Admitting: Obstetrics and Gynecology

## 2016-11-16 DIAGNOSIS — R928 Other abnormal and inconclusive findings on diagnostic imaging of breast: Secondary | ICD-10-CM

## 2016-11-16 DIAGNOSIS — N6320 Unspecified lump in the left breast, unspecified quadrant: Secondary | ICD-10-CM

## 2016-11-16 DIAGNOSIS — N6489 Other specified disorders of breast: Secondary | ICD-10-CM | POA: Diagnosis not present

## 2016-11-16 DIAGNOSIS — R922 Inconclusive mammogram: Secondary | ICD-10-CM | POA: Diagnosis not present

## 2016-11-21 ENCOUNTER — Other Ambulatory Visit: Payer: 59

## 2016-11-22 ENCOUNTER — Other Ambulatory Visit: Payer: Self-pay | Admitting: Obstetrics and Gynecology

## 2016-11-22 DIAGNOSIS — N6320 Unspecified lump in the left breast, unspecified quadrant: Secondary | ICD-10-CM

## 2016-11-23 ENCOUNTER — Ambulatory Visit
Admission: RE | Admit: 2016-11-23 | Discharge: 2016-11-23 | Disposition: A | Discharge status: Home / Self Care | Payer: 59 | Source: Ambulatory Visit | Attending: Obstetrics and Gynecology | Admitting: Obstetrics and Gynecology

## 2016-11-23 ENCOUNTER — Inpatient Hospital Stay: Admission: RE | Admit: 2016-11-23 | Payer: 59 | Source: Ambulatory Visit

## 2016-11-23 DIAGNOSIS — N6320 Unspecified lump in the left breast, unspecified quadrant: Secondary | ICD-10-CM

## 2016-11-23 DIAGNOSIS — N6324 Unspecified lump in the left breast, lower inner quadrant: Secondary | ICD-10-CM | POA: Diagnosis not present

## 2016-11-23 DIAGNOSIS — D242 Benign neoplasm of left breast: Secondary | ICD-10-CM | POA: Diagnosis not present

## 2016-12-07 ENCOUNTER — Ambulatory Visit (INDEPENDENT_AMBULATORY_CARE_PROVIDER_SITE_OTHER): Payer: 59 | Admitting: Podiatry

## 2016-12-07 ENCOUNTER — Encounter: Payer: Self-pay | Admitting: Podiatry

## 2016-12-07 DIAGNOSIS — M67472 Ganglion, left ankle and foot: Secondary | ICD-10-CM | POA: Diagnosis not present

## 2016-12-09 NOTE — Progress Notes (Signed)
   Subjective:  Patient presents today for follow up of a ganglion cyst over the dorsal aspect of the left foot. She states the cyst looks better and is smaller, but has not completely healed. Patient is a Marine scientist at [employer].    Objective/Physical Exam General: The patient is alert and oriented x3 in no acute distress.  Dermatology: Skin is warm, dry and supple bilateral lower extremities. Negative for open lesions or macerations.  Vascular: Palpable pedal pulses bilaterally. No edema or erythema noted. Capillary refill within normal limits.  Neurological: Epicritic and protective threshold grossly intact bilaterally.   Musculoskeletal Exam: There is a palpable fluctuant mass noted to the dorsum of the left foot. Minimal pain on palpation noted.  Assessment: #1 ganglion cyst left foot- improved   Plan of Care:  #1 Patient was evaluated. #2 Return to clinic as needed.  Edrick Kins, DPM [facility]  Dr. Edrick Kins, [address]                                        [place], [place] [address]                Office [phone]  Fax [phone]

## 2017-02-18 DIAGNOSIS — R109 Unspecified abdominal pain: Secondary | ICD-10-CM | POA: Diagnosis not present

## 2017-02-20 DIAGNOSIS — R109 Unspecified abdominal pain: Secondary | ICD-10-CM | POA: Diagnosis not present

## 2017-03-07 ENCOUNTER — Telehealth: Payer: Self-pay | Admitting: Internal Medicine

## 2017-03-07 NOTE — Telephone Encounter (Signed)
[facility]  Patient Name: Amanda [name]  DOB: 03/27/1976    Initial Comment Caller states her BP has been high 150/77. Left quadrant pain. Headache.   Nurse Assessment  Nurse: Harlow Mares, RN, Suanne Marker Date/Time (Eastern Time): 03/07/2017 10:52:51 AM  Confirm and document reason for call. If symptomatic, describe symptoms. ---Caller states her BP has been high 150/77. Left quadrant pain. Headache. Caller reports that she wants to be checked for H Pylori.  Does the patient have any new or worsening symptoms? ---Yes  Will a triage be completed? ---Yes  Related visit to physician within the last 2 weeks? ---Yes  Does the PT have any chronic conditions? (i.e. diabetes, asthma, etc.) ---No  Is the patient pregnant or possibly pregnant? (Ask all females between the ages of 46-55) ---No  Is this a behavioral health or substance abuse call? ---No     Guidelines    Guideline Title Affirmed Question Affirmed Notes  Abdominal Pain - Female [1] MODERATE pain (e.g., interferes with normal activities) AND [2] pain comes and goes (cramps) AND [3] present > 24 hours (Exception: pain with Vomiting or Diarrhea - see that Guideline)    Final Disposition User   See Physician within 24 Hours Harlow Mares, RN, Suanne Marker    Comments  Appt scheduled for 03/13/17 @ 9:15 am with Golden Hurter, NP at the [facility] location. No available appts today and caller refused to go to [facility]. Wanted to wait until next Monday to be seen.   Referrals  REFERRED TO PCP OFFICE   Disagree/Comply: Comply

## 2017-03-07 NOTE — Telephone Encounter (Signed)
Pt has appt with Avie Echevaria NP on 03/13/17 at 9:15.

## 2017-03-13 ENCOUNTER — Ambulatory Visit (INDEPENDENT_AMBULATORY_CARE_PROVIDER_SITE_OTHER): Payer: 59 | Admitting: Internal Medicine

## 2017-03-13 ENCOUNTER — Encounter: Payer: Self-pay | Admitting: Internal Medicine

## 2017-03-13 VITALS — BP 136/92 | HR 67 | Temp 98.6°F | Wt 220.0 lb

## 2017-03-13 DIAGNOSIS — I10 Essential (primary) hypertension: Secondary | ICD-10-CM | POA: Diagnosis not present

## 2017-03-13 DIAGNOSIS — R1013 Epigastric pain: Secondary | ICD-10-CM | POA: Diagnosis not present

## 2017-03-13 MED ORDER — LOSARTAN POTASSIUM 25 MG PO TABS: 25.0000 mg | ORAL_TABLET | Freq: Every day | ORAL | 0 refills | Status: DC

## 2017-03-13 NOTE — Addendum Note (Signed)
Addended by: Mady Haagensen on: 03/13/2017 02:35 PM   Modules accepted: Orders

## 2017-03-13 NOTE — Patient Instructions (Signed)

## 2017-03-13 NOTE — Addendum Note (Signed)
Addended by: Mady Haagensen on: 03/13/2017 02:48 PM   Modules accepted: Orders

## 2017-03-13 NOTE — Assessment & Plan Note (Signed)
Will start Losartan 25 mg daily Encouraged exercise for weight loss Handout given on DASH diet

## 2017-03-13 NOTE — Progress Notes (Signed)
Subjective:    Patient ID: Amanda [name], female    DOB: June 02, 1976, 41 y.o.   MRN: [id number]  HPI  Pt presents to the clinic today with multiple concerns.  1- She reports her blood pressure has been elevated. It has been as high as 150/77. Her BP today is 136/92.She has been having mild headaches. She denies dizziness, visual changes, chest pain or shortness of breath.  2- She has been complaining of left upper quadrant abdominal pain. This started 5-6 weeks ago. The pain is intermittent. She described the pain as uncomfortable. It was radiating to her back but she reports this has resolved. She reports associated nausea  She denies gas or bloating. Her bowels are moving normally. She has tried Gaviscon with some relief. She saw Leonides Grills, PA at Urgent Care 6/18. Amylase, Lipase, CMP and CBC were all normal. She was given a RX for Norco but reports she is not longer taking this. She is requesting to be checked for H Pylori.  Review of Systems  Past Medical History:  Diagnosis Date  . Anemia 10/09/2006  . Depression   . Insomnia 10/09/2006  . Itchy eyes 01/06/2006  . Menorrhagia 08/18/2009  . Nausea 10/09/2006  . 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    Current Outpatient Prescriptions  Medication Sig Dispense Refill  . acetaminophen (TYLENOL) 325 MG tablet Take 650 mg by mouth every 6 (six) hours as needed for pain.    Marland Kitchen ibuprofen (ADVIL,MOTRIN) 800 MG tablet Take 1 tablet (800 mg total) by mouth every 8 (eight) hours as needed. 30 tablet 0  . Omega-3 Fatty Acids (FISH OIL) 1200 MG CAPS Take by mouth daily.     Current Facility-Administered Medications  Medication Dose Route Frequency Provider Last Rate Last Dose  . betamethasone acetate-betamethasone sodium phosphate (CELESTONE) injection 3 mg  3 mg Intramuscular Once Edrick Kins, DPM        No Known Allergies  Family History  Problem Relation Age of Onset  . Hypertension Mother   . Cancer Neg Hx     Social History    Social History  . Marital status: Married    Spouse name: N/A  . Number of children: N/A  . Years of education: N/A   Occupational History  . Not on file.   Social History Main Topics  . Smoking status: Never Smoker  . Smokeless tobacco: Never Used  . Alcohol use 0.0 oz/week     Comment: rare  . Drug use: No  . Sexual activity: Yes   Other Topics Concern  . Not on file   Social History Narrative  . No narrative on file     Constitutional: Pt reports headaches. Denies fever, malaise, fatigue, or abrupt weight changes.  Respiratory: Denies difficulty breathing, shortness of breath, cough or sputum production.   Cardiovascular: Denies chest pain, chest tightness, palpitations or swelling in the hands or feet.  Gastrointestinal: Pt reports abdominal pains. Denies bloating, constipation, diarrhea or blood in the stool.  GU: Denies urgency, frequency, pain with urination, burning sensation, blood in urine, odor or discharge. Neurological: Denies dizziness, difficulty with memory, difficulty with speech or problems with balance and coordination.   No other specific complaints in a complete review of systems (except as listed in HPI above).     Objective:   Physical Exam   BP (!) 136/92   Pulse 67   Temp 98.6 F (37 C) (Oral)   Wt 220 lb (99.8 kg)   SpO2 98%  BMI 34.46 kg/m  Wt Readings from Last 3 Encounters:  03/13/17 220 lb (99.8 kg)  10/21/16 216 lb 8 oz (98.2 kg)  06/04/16 220 lb (99.8 kg)    General: Appears her stated age, obese in NAD. Cardiovascular: Normal rate and rhythm. S1,S2 noted.  No murmur, rubs or gallops noted. No JVD or BLE edema.  Pulmonary/Chest: Normal effort and positive vesicular breath sounds. No respiratory distress. No wheezes, rales or ronchi noted.  Abdomen: Soft and tender in the epigastric region. Normal bowel sounds. No distention or masses noted. Neurological: Alert and oriented.     BMET    Component Value Date/Time   NA  143 01/28/2014 0854   K 4.7 01/28/2014 0854   CL 110 (H) 01/28/2014 0854   CO2 17 (L) 01/28/2014 0854   GLUCOSE 92 01/28/2014 0854   GLUCOSE 92 01/14/2013 1538   BUN 10 01/28/2014 0854   CREATININE 0.73 01/28/2014 0854   CALCIUM 9.3 01/28/2014 0854   GFRNONAA 106 01/28/2014 0854   GFRAA 122 01/28/2014 0854    Lipid Panel     Component Value Date/Time   CHOL 155 01/28/2014 0854   TRIG 54 01/28/2014 0854   HDL 54 01/28/2014 0854   CHOLHDL 2.9 01/28/2014 0854   CHOLHDL 3 01/14/2013 1538   VLDL 18.0 01/14/2013 1538   LDLCALC 90 01/28/2014 0854    CBC    Component Value Date/Time   WBC 5.4 01/28/2014 0854   WBC 8.1 01/14/2013 1538   RBC 4.80 01/28/2014 0854   RBC 4.23 01/14/2013 1538   HGB 10.2 (L) 01/28/2014 0854   HCT 33.5 (L) 01/28/2014 0854   PLT 293.0 01/14/2013 1538   MCV 70 (L) 01/28/2014 0854   MCH 21.3 (L) 01/28/2014 0854   MCHC 30.4 (L) 01/28/2014 0854   MCHC 32.6 01/14/2013 1538   RDW 17.1 (H) 01/28/2014 0854   LYMPHSABS 2.2 01/28/2014 0854   EOSABS 0.1 01/28/2014 0854   BASOSABS 0.0 01/28/2014 0854    Hgb A1C Lab Results  Component Value Date   HGBA1C 5.6 01/14/2013           Assessment & Plan:   Epigastric Pain:  ? Reflux Will check H Pylori today per pt request If negative, will try 14 day course of Prilosec OTC  RTC in 3 weeks for BP check Amanda Shuping, NP

## 2017-03-14 ENCOUNTER — Other Ambulatory Visit: Payer: Self-pay | Admitting: Internal Medicine

## 2017-03-14 LAB — H. PYLORI ANTIBODY, IGG: H. pylori, IgG AbS: 2.06 Index Value — ABNORMAL HIGH (ref 0.00–0.79)

## 2017-03-14 MED ORDER — CLARITHROMYCIN 500 MG PO TABS: 500.0000 mg | ORAL_TABLET | Freq: Two times a day (BID) | ORAL | 0 refills | Status: DC

## 2017-03-14 MED ORDER — PANTOPRAZOLE SODIUM 40 MG PO TBEC: 40.0000 mg | DELAYED_RELEASE_TABLET | Freq: Two times a day (BID) | ORAL | 0 refills | Status: DC

## 2017-03-14 MED ORDER — METRONIDAZOLE 500 MG PO TABS: 500.0000 mg | ORAL_TABLET | Freq: Three times a day (TID) | ORAL | 0 refills | Status: DC

## 2017-04-09 ENCOUNTER — Other Ambulatory Visit: Payer: Self-pay | Admitting: Internal Medicine

## 2017-05-29 ENCOUNTER — Ambulatory Visit (HOSPITAL_COMMUNITY)
Admission: EM | Admit: 2017-05-29 | Discharge: 2017-05-29 | Disposition: A | Discharge status: Home / Self Care | Payer: 59

## 2017-05-29 DIAGNOSIS — H6692 Otitis media, unspecified, left ear: Secondary | ICD-10-CM | POA: Diagnosis not present

## 2017-05-29 DIAGNOSIS — R51 Headache: Secondary | ICD-10-CM | POA: Diagnosis not present

## 2017-05-29 DIAGNOSIS — D649 Anemia, unspecified: Secondary | ICD-10-CM | POA: Diagnosis not present

## 2017-06-02 ENCOUNTER — Ambulatory Visit (INDEPENDENT_AMBULATORY_CARE_PROVIDER_SITE_OTHER): Payer: 59 | Admitting: Family Medicine

## 2017-06-02 ENCOUNTER — Other Ambulatory Visit: Payer: Self-pay | Admitting: *Deleted

## 2017-06-02 ENCOUNTER — Encounter: Payer: Self-pay | Admitting: Family Medicine

## 2017-06-02 VITALS — BP 140/80 | HR 78 | Temp 98.7°F | Ht 67.0 in | Wt 220.5 lb

## 2017-06-02 DIAGNOSIS — D649 Anemia, unspecified: Secondary | ICD-10-CM | POA: Insufficient documentation

## 2017-06-02 DIAGNOSIS — M25512 Pain in left shoulder: Secondary | ICD-10-CM | POA: Diagnosis not present

## 2017-06-02 DIAGNOSIS — Z23 Encounter for immunization: Secondary | ICD-10-CM

## 2017-06-02 MED ORDER — DICLOFENAC SODIUM 75 MG PO TBEC: 75.0000 mg | DELAYED_RELEASE_TABLET | Freq: Two times a day (BID) | ORAL | 0 refills | Status: DC

## 2017-06-02 NOTE — Assessment & Plan Note (Signed)
Rototor cuff tendonitis +/- AC joint injury.  Treat with heat, NSAIDS, start home PT.  No heavy lifting > 10 lbs or repetitive pulling or shoulder use.  If not improving in 2 weeks follow up.

## 2017-06-02 NOTE — Addendum Note (Signed)
Addended by: Carter Kitten on: 06/02/2017 11:27 AM   Modules accepted: Orders

## 2017-06-02 NOTE — Progress Notes (Signed)
Subjective:    Patient ID: Amanda [name], female    DOB: 08/17/1976, 41 y.o.   MRN: [id number]  HPI    41 year old female  pt of [facility] presents for follow up shoulder pain.  She reports she has had 2 weeks of pain in left shoulder.  No known fall, no change in activity.  Works as a Marine scientist, uses upper body a lot.  Pain generalized in left upper shoulder, chest wqall and to arm.  She has noted popping in shoulder. Pain with raising left arm up. No numbness and tingling in hand, no weakness.  Moving neck makes left arm hurt as well.  Using tylenol occ for pain.. Did not help.  [facility] 3 days ago.  Given Torodol injection. Dx left ear infection ( was not having ear pain).. Treated with  Cefdinir. Has not helped.    She also left sided headache.    Review of Systems  Constitutional: Negative for fatigue and fever.  HENT: Negative for ear pain.   Eyes: Negative for pain.  Respiratory: Negative for chest tightness and shortness of breath.   Cardiovascular: Negative for chest pain, palpitations and leg swelling.  Gastrointestinal: Negative for abdominal pain.  Genitourinary: Negative for dysuria.       Objective:   Physical Exam  Constitutional: Vital signs are normal. She appears well-developed and well-nourished. She is cooperative.  Non-toxic appearance. She does not appear ill. No distress.  HENT:  Head: Normocephalic.  Right Ear: Hearing, tympanic membrane, external ear and ear canal normal. Tympanic membrane is not erythematous, not retracted and not bulging.  Left Ear: Hearing, tympanic membrane, external ear and ear canal normal. Tympanic membrane is not erythematous, not retracted and not bulging.  Nose: No mucosal edema or rhinorrhea. Right sinus exhibits no maxillary sinus tenderness and no frontal sinus tenderness. Left sinus exhibits no maxillary sinus tenderness and no frontal sinus tenderness.  Mouth/Throat: Uvula is midline, oropharynx  is clear and moist and mucous membranes are normal.  Eyes: Pupils are equal, round, and reactive to light. Conjunctivae, EOM and lids are normal. Lids are everted and swept, no foreign bodies found.  Neck: Trachea normal and normal range of motion. Neck supple. Carotid bruit is not present. No thyroid mass and no thyromegaly present.  Cardiovascular: Normal rate, regular rhythm, S1 normal, S2 normal, normal heart sounds, intact distal pulses and normal pulses.  Exam reveals no gallop and no friction rub.   No murmur heard. Pulmonary/Chest: Effort normal and breath sounds normal. No tachypnea. No respiratory distress. She has no decreased breath sounds. She has no wheezes. She has no rhonchi. She has no rales.  Abdominal: Soft. Normal appearance and bowel sounds are normal. There is no tenderness.  Musculoskeletal:       Right shoulder: Normal. She exhibits normal range of motion, no tenderness and no bony tenderness.       Left shoulder: She exhibits decreased range of motion, tenderness and bony tenderness. She exhibits no swelling and no effusion.       Cervical back: Normal. She exhibits normal range of motion, no tenderness and no bony tenderness.  Neg spurling ttp over right anterior shoulder in subacromial joint, ttp over AC joint  positive neer's and neg drop arm test  Neurological: She is alert.  Skin: Skin is warm, dry and intact. No rash noted.  Psychiatric: Her speech is normal and behavior is normal. Judgment and thought content normal. Her mood appears  not anxious. Cognition and memory are normal. She does not exhibit a depressed mood.          Assessment & Plan:

## 2017-06-02 NOTE — Patient Instructions (Signed)
Rototor cuff tendonitis +/- AC joint injury.  Treat with heat, diclofenac twice daily, start home PT.  No heavy lifting > 10 lbs or repetitive pulling or shoulder use.  If not improving in 2 weeks follow up. Okay to stop antibitoics.

## 2017-09-23 DIAGNOSIS — R05 Cough: Secondary | ICD-10-CM | POA: Diagnosis not present

## 2017-09-23 DIAGNOSIS — J019 Acute sinusitis, unspecified: Secondary | ICD-10-CM | POA: Diagnosis not present

## 2017-09-23 DIAGNOSIS — M75102 Unspecified rotator cuff tear or rupture of left shoulder, not specified as traumatic: Secondary | ICD-10-CM | POA: Diagnosis not present

## 2018-08-26 ENCOUNTER — Ambulatory Visit (HOSPITAL_COMMUNITY)
Admission: EM | Admit: 2018-08-26 | Discharge: 2018-08-26 | Disposition: A | Discharge status: Home / Self Care | Payer: PRIVATE HEALTH INSURANCE | Attending: Internal Medicine | Admitting: Internal Medicine

## 2018-08-26 ENCOUNTER — Encounter (HOSPITAL_COMMUNITY): Payer: Self-pay | Admitting: *Deleted

## 2018-08-26 ENCOUNTER — Other Ambulatory Visit: Payer: Self-pay

## 2018-08-26 DIAGNOSIS — B9789 Other viral agents as the cause of diseases classified elsewhere: Secondary | ICD-10-CM

## 2018-08-26 DIAGNOSIS — J069 Acute upper respiratory infection, unspecified: Secondary | ICD-10-CM | POA: Diagnosis not present

## 2018-08-26 MED ORDER — FLUTICASONE PROPIONATE 50 MCG/ACT NA SUSP: 2.0000 | Freq: Every day | NASAL | 0 refills | Status: DC

## 2018-08-26 MED ORDER — IPRATROPIUM BROMIDE 0.06 % NA SOLN: 2.0000 | Freq: Four times a day (QID) | NASAL | 0 refills | Status: DC

## 2018-08-26 MED ORDER — BENZONATATE 100 MG PO CAPS: 100.0000 mg | ORAL_CAPSULE | Freq: Three times a day (TID) | ORAL | 0 refills | Status: DC

## 2018-08-26 NOTE — ED Triage Notes (Signed)
C/O cough x3 days.  Unsure if fevers.  States daughter had influenza 2 wks ago

## 2018-08-26 NOTE — Discharge Instructions (Signed)
Tessalon for cough. Start flonase, atrovent nasal spray for nasal congestion/drainage. You can use over the counter nasal saline rinse such as neti pot for nasal congestion. Keep hydrated, your urine should be clear to pale yellow in color. Tylenol/motrin for fever and pain. Monitor for any worsening of symptoms, chest pain, shortness of breath, wheezing, swelling of the throat, follow up for reevaluation.   For sore throat/cough try using a honey-based tea. Use 3 teaspoons of honey with juice squeezed from half lemon. Place shaved pieces of ginger into 1/2-1 cup of water and warm over stove top. Then mix the ingredients and repeat every 4 hours as needed.

## 2018-08-26 NOTE — ED Provider Notes (Signed)
[facility]    CSN: [id number] Arrival date & time: 08/26/18  1608     History   Chief Complaint Chief Complaint  Patient presents with  . Appointment    4:30pm  . Cough    HPI Amanda [name] is a 42 y.o. female.   42 year old female comes in for few day history of URI symptoms.  Has a cough, rhinorrhea, nasal congestion, headache.  Cough can be productive at times.  Denies chest pain, shortness of breath, wheezing.  Denies fever, chills, night sweats.  OTC cold medication without relief.  Never smoker.  Positive sick contact.     Past Medical History:  Diagnosis Date  . Anemia 10/09/2006  . Depression   . Insomnia 10/09/2006  . Itchy eyes 01/06/2006  . Menorrhagia 08/18/2009  . Nausea 10/09/2006  . 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    Patient Active Problem List   Diagnosis Date Noted  . Anemia 06/02/2017  . Acute pain of left shoulder 06/02/2017  . Essential hypertension 03/13/2017  . 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 Depression 11/15/2011    History reviewed. No pertinent surgical history.  OB History   No obstetric history on file.      Home Medications    Prior to Admission medications   Medication Sig Start Date End Date Taking? Authorizing Provider  benzonatate (TESSALON) 100 MG capsule Take 1 capsule (100 mg total) by mouth every 8 (eight) hours. 08/26/18   Tasia Catchings, Amy V, PA-C  fluticasone (FLONASE) 50 MCG/ACT nasal spray Place 2 sprays into both nostrils daily. 08/26/18   Tasia Catchings, Amy V, PA-C  ipratropium (ATROVENT) 0.06 % nasal spray Place 2 sprays into both nostrils 4 (four) times daily. 08/26/18   Ok Edwards, PA-C    Family History Family History  Problem Relation Age of Onset  . Hypertension Mother   . Cancer Neg Hx     Social History Social History   Tobacco Use  . Smoking status: Never Smoker  . Smokeless tobacco: Never Used  Substance Use Topics  . Alcohol use: Not Currently    Comment: rare  . Drug use: No     Allergies     Tramadol and Sulfa antibiotics   Review of Systems Review of Systems  Reason unable to perform ROS: See HPI as above.     Physical Exam Triage Vital Signs ED Triage Vitals  Enc Vitals Group     BP 08/26/18 1639 137/87     Pulse Rate 08/26/18 1637 82     Resp 08/26/18 1637 20     Temp 08/26/18 1637 98.8 F (37.1 C)     Temp Source 08/26/18 1637 Oral     SpO2 08/26/18 1637 100 %     Weight --      Height --      Head Circumference --      Peak Flow --      Pain Score 08/26/18 1639 0     Pain Loc --      Pain Edu? --      Excl. in [place]? --    No data found.  Updated Vital Signs BP 137/87   Pulse 82   Temp 98.8 F (37.1 C) (Oral)   Resp 20   LMP 08/20/2018 (Exact Date)   SpO2 100%   Physical Exam Constitutional:      General: She is not in acute distress.    Appearance: She is well-developed.  HENT:     Head: Normocephalic  and atraumatic.     Right Ear: Tympanic membrane, ear canal and external ear normal. Tympanic membrane is not erythematous or bulging.     Left Ear: Tympanic membrane, ear canal and external ear normal. Tympanic membrane is not erythematous or bulging.     Nose: Congestion and rhinorrhea present.     Right Sinus: No maxillary sinus tenderness or frontal sinus tenderness.     Left Sinus: No maxillary sinus tenderness or frontal sinus tenderness.     Mouth/Throat:     Pharynx: Uvula midline.  Eyes:     Conjunctiva/sclera: Conjunctivae normal.     Pupils: Pupils are equal, round, and reactive to light.  Neck:     Musculoskeletal: Normal range of motion and neck supple.  Cardiovascular:     Rate and Rhythm: Normal rate and regular rhythm.     Heart sounds: Normal heart sounds. No murmur. No friction rub. No gallop.   Pulmonary:     Effort: Pulmonary effort is normal.     Breath sounds: Normal breath sounds. No decreased breath sounds, wheezing, rhonchi or rales.  Lymphadenopathy:     Cervical: No cervical adenopathy.  Skin:    General:  Skin is warm and dry.  Neurological:     Mental Status: She is alert and oriented to person, place, and time.  Psychiatric:        Behavior: Behavior normal.        Judgment: Judgment normal.      UC Treatments / Results  Labs (all labs ordered are listed, but only abnormal results are displayed) Labs Reviewed - No data to display  EKG None  Radiology No results found.  Procedures Procedures (including critical care time)  Medications Ordered in UC Medications - No data to display  Initial Impression / Assessment and Plan / UC Course  I have reviewed the triage vital signs and the nursing notes.  Pertinent labs & imaging results that were available during my care of the patient were reviewed by me and considered in my medical decision making (see chart for details).    Discussed with patient history and exam most consistent with viral URI. Symptomatic treatment as needed. Push fluids. Return precautions given.   Final Clinical Impressions(s) / UC Diagnoses   Final diagnoses:  Viral URI with cough    ED Prescriptions    Medication Sig Dispense Auth. Provider   ipratropium (ATROVENT) 0.06 % nasal spray Place 2 sprays into both nostrils 4 (four) times daily. 15 mL Yu, Amy V, PA-C   fluticasone (FLONASE) 50 MCG/ACT nasal spray Place 2 sprays into both nostrils daily. 1 g Yu, Amy V, PA-C   benzonatate (TESSALON) 100 MG capsule Take 1 capsule (100 mg total) by mouth every 8 (eight) hours. 21 capsule Tobin Chad, [place] 08/26/18 1731

## 2019-06-11 ENCOUNTER — Other Ambulatory Visit: Payer: Self-pay | Admitting: Obstetrics & Gynecology

## 2019-12-18 ENCOUNTER — Other Ambulatory Visit: Payer: Self-pay

## 2019-12-18 ENCOUNTER — Encounter: Payer: Self-pay | Admitting: Internal Medicine

## 2019-12-18 ENCOUNTER — Ambulatory Visit: Payer: PRIVATE HEALTH INSURANCE | Admitting: Internal Medicine

## 2019-12-18 VITALS — BP 124/76 | HR 78 | Temp 98.4°F | Wt 242.0 lb

## 2019-12-18 DIAGNOSIS — I10 Essential (primary) hypertension: Secondary | ICD-10-CM | POA: Diagnosis not present

## 2019-12-18 MED ORDER — OLMESARTAN MEDOXOMIL 20 MG PO TABS: ORAL_TABLET | ORAL | 1 refills | Status: DC

## 2019-12-18 NOTE — Progress Notes (Signed)
Subjective:    Patient ID: Amanda [name], female    DOB: 13-Apr-1976, 44 y.o.   MRN: [id number]  HPI  Pt presents to the clinic today for Urgent Care Follow up. She went to [facility] 2 months ago with c/o elevated blood pressure. She was started on Olmesartan. She has been taking the medication every other day instead of daily as prescribed. She denies adverse side effects. Her BP today is 124/76.  ECG from 09/2011 reviewed.  Review of Systems      Past Medical History:  Diagnosis Date  . Anemia 10/09/2006  . Depression   . Insomnia 10/09/2006  . Itchy eyes 01/06/2006  . Menorrhagia 08/18/2009  . Nausea 10/09/2006  . 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    Current Outpatient Medications  Medication Sig Dispense Refill  . benzonatate (TESSALON) 100 MG capsule Take 1 capsule (100 mg total) by mouth every 8 (eight) hours. 21 capsule 0  . fluticasone (FLONASE) 50 MCG/ACT nasal spray Place 2 sprays into both nostrils daily. 1 g 0  . ipratropium (ATROVENT) 0.06 % nasal spray Place 2 sprays into both nostrils 4 (four) times daily. 15 mL 0   No current facility-administered medications for this visit.    Allergies  Allergen Reactions  . Tramadol Other (See Comments)  . Sulfa Antibiotics     Family History  Problem Relation Age of Onset  . Hypertension Mother   . Cancer Neg Hx     Social History   Socioeconomic History  . Marital status: Married    Spouse name: Not on file  . Number of children: Not on file  . Years of education: Not on file  . Highest education level: Not on file  Occupational History  . Not on file  Tobacco Use  . Smoking status: Never Smoker  . Smokeless tobacco: Never Used  Substance and Sexual Activity  . Alcohol use: Not Currently    Comment: rare  . Drug use: No  . Sexual activity: Not on file  Other Topics Concern  . Not on file  Social History Narrative  . Not on file   Social Determinants of Health   Financial Resource Strain:     . Difficulty of Paying Living Expenses:   Food Insecurity:   . Worried About Charity fundraiser in the Last Year:   . Arboriculturist in the Last Year:   Transportation Needs:   . Film/video editor (Medical):   Marland Kitchen Lack of Transportation (Non-Medical):   Physical Activity:   . Days of Exercise per Week:   . Minutes of Exercise per Session:   Stress:   . Feeling of Stress :   Social Connections:   . Frequency of Communication with Friends and Family:   . Frequency of Social Gatherings with Friends and Family:   . Attends Religious Services:   . Active Member of Clubs or Organizations:   . Attends Archivist Meetings:   Marland Kitchen Marital Status:   Intimate Partner Violence:   . Fear of Current or Ex-Partner:   . Emotionally Abused:   Marland Kitchen Physically Abused:   . Sexually Abused:      Constitutional: Denies fever, malaise, fatigue, headache or abrupt weight changes.  Respiratory: Denies difficulty breathing, shortness of breath, cough or sputum production.   Cardiovascular: Denies chest pain, chest tightness, palpitations or swelling in the hands or feet.  Musculoskeletal: Pt reports intermittent muscle aches. Denies decrease in range of motion, difficulty  with gait, muscle pain or joint pain and swelling.  Neurological: Denies dizziness, difficulty with memory, difficulty with speech or problems with balance and coordination.  .  No other specific complaints in a complete review of systems (except as listed in HPI above).  Objective:   Physical Exam BP 124/76   Pulse 78   Temp 98.4 F (36.9 C) (Temporal)   Wt 242 lb (109.8 kg)   SpO2 99%   BMI 37.90 kg/m   Wt Readings from Last 3 Encounters:  03/19/13 204 lb (92.5 kg)  01/14/13 202 lb 8 oz (91.9 kg)  04/04/12 209 lb (94.8 kg)    General: Appears her stated age, obese, in NAD. Cardiovascular: Normal rate and rhythm. S1,S2 noted.  No murmur, rubs or gallops noted.  Pulmonary/Chest: Normal effort and positive  vesicular breath sounds. No respiratory distress. No wheezes, rales or ronchi noted.  Neurological: Alert and oriented.    BMET    Component Value Date/Time   NA 143 01/28/2014 0854   K 4.7 01/28/2014 0854   CL 110 (H) 01/28/2014 0854   CO2 17 (L) 01/28/2014 0854   GLUCOSE 92 01/28/2014 0854   GLUCOSE 92 01/14/2013 1538   BUN 10 01/28/2014 0854   CREATININE 0.73 01/28/2014 0854   CALCIUM 9.3 01/28/2014 0854   GFRNONAA 106 01/28/2014 0854   GFRAA 122 01/28/2014 0854    Lipid Panel     Component Value Date/Time   CHOL 155 01/28/2014 0854   TRIG 54 01/28/2014 0854   HDL 54 01/28/2014 0854   CHOLHDL 2.9 01/28/2014 0854   CHOLHDL 3 01/14/2013 1538   VLDL 18.0 01/14/2013 1538   LDLCALC 90 01/28/2014 0854    CBC    Component Value Date/Time   WBC 5.4 01/28/2014 0854   WBC 8.1 01/14/2013 1538   RBC 4.80 01/28/2014 0854   RBC 4.23 01/14/2013 1538   HGB 10.2 (L) 01/28/2014 0854   HCT 33.5 (L) 01/28/2014 0854   PLT 293.0 01/14/2013 1538   MCV 70 (L) 01/28/2014 0854   MCH 21.3 (L) 01/28/2014 0854   MCHC 30.4 (L) 01/28/2014 0854   MCHC 32.6 01/14/2013 1538   RDW 17.1 (H) 01/28/2014 0854   LYMPHSABS 2.2 01/28/2014 0854   EOSABS 0.1 01/28/2014 0854   BASOSABS 0.0 01/28/2014 0854    Hgb A1C Lab Results  Component Value Date   HGBA1C 5.6 01/14/2013           Assessment & Plan:   UC Follow Up for HTN:  Not taking medication as directed Will have her stop and recheck BP in 1 month If develops headache, dizziness or visual changes should restart medicine BMET today Encouraged low salt diet, exercise for weight loss  RTC in 1 month for annual exam, follow up HTN Webb Silversmith, NP This visit occurred during the SARS-CoV-2 public health emergency.  Safety protocols were in place, including screening questions prior to the visit, additional usage of staff PPE, and extensive cleaning of exam room while observing appropriate contact time as indicated for disinfecting  solutions.

## 2019-12-18 NOTE — Patient Instructions (Signed)

## 2019-12-19 LAB — BASIC METABOLIC PANEL
BUN/Creatinine Ratio: 13 (ref 9–23)
BUN: 10 mg/dL (ref 6–24)
CO2: 23 mmol/L (ref 20–29)
Calcium: 9.8 mg/dL (ref 8.7–10.2)
Chloride: 107 mmol/L — ABNORMAL HIGH (ref 96–106)
Creatinine, Ser: 0.77 mg/dL (ref 0.57–1.00)
GFR calc Af Amer: 109 mL/min/{1.73_m2} (ref >59)
GFR calc non Af Amer: 95 mL/min/{1.73_m2} (ref >59)
Glucose: 85 mg/dL (ref 65–99)
Potassium: 3.9 mmol/L (ref 3.5–5.2)
Sodium: 142 mmol/L (ref 134–144)

## 2020-01-06 ENCOUNTER — Telehealth: Payer: Self-pay | Admitting: Internal Medicine

## 2020-01-06 MED ORDER — OLMESARTAN MEDOXOMIL 20 MG PO TABS: 20.0000 mg | ORAL_TABLET | Freq: Every day | ORAL | 1 refills | Status: DC

## 2020-01-06 MED ORDER — OLMESARTAN MEDOXOMIL 20 MG PO TABS: ORAL_TABLET | ORAL | 1 refills | Status: DC

## 2020-01-06 NOTE — Telephone Encounter (Signed)
For some reason the prescription was under print, but it was refilled... will refill e-scribe.Marland KitchenMarland Kitchen

## 2020-01-06 NOTE — Telephone Encounter (Signed)
Patient called She stated she was in on 4/14 and requested a refill on her olmesartan [facility])  But the pharmacy has not received a refill  Patient stated that she is completely out of medication   Please advise

## 2020-01-15 ENCOUNTER — Encounter: Payer: Self-pay | Admitting: Internal Medicine

## 2020-01-15 ENCOUNTER — Other Ambulatory Visit: Payer: Self-pay

## 2020-01-15 ENCOUNTER — Ambulatory Visit (INDEPENDENT_AMBULATORY_CARE_PROVIDER_SITE_OTHER): Payer: PRIVATE HEALTH INSURANCE | Admitting: Internal Medicine

## 2020-01-15 ENCOUNTER — Other Ambulatory Visit: Payer: Self-pay | Admitting: Internal Medicine

## 2020-01-15 VITALS — BP 132/84 | HR 96 | Temp 97.3°F | Ht 67.0 in | Wt 247.0 lb

## 2020-01-15 DIAGNOSIS — I10 Essential (primary) hypertension: Secondary | ICD-10-CM | POA: Diagnosis not present

## 2020-01-15 DIAGNOSIS — F325 Major depressive disorder, single episode, in full remission: Secondary | ICD-10-CM

## 2020-01-15 DIAGNOSIS — Z0001 Encounter for general adult medical examination with abnormal findings: Secondary | ICD-10-CM

## 2020-01-15 NOTE — Progress Notes (Signed)
Subjective:    Patient ID: Amanda [name], female    DOB: 06-17-1976, 44 y.o.   MRN: [id number]  HPI  Pt presents to the clinic today for her annual exam. She is also due to follow up chronic conditions.  HTN: Her BP today is 132/84. She is taking Olmesartan as prescribed. ECG from 09/2011 reviewed.  Depression: Currently not an issue. She is not taking any medications for this. She does not see a therapist. She denies anxiety, SI/HI.  Flu: 05/2019 Covid: 10/2019, does not want to get second dose Tetanus: 05/2017 Pap Smear: 11/2016 Vision Screening: annually Dentist: annually  Diet: She does eat meat. She consumes fruits, vegetables. She does ea some fried foods.  She drinks mostly water. Exercise: None  Review of Systems  Past Medical History:  Diagnosis Date  . Anemia 10/09/2006  . Depression   . Insomnia 10/09/2006  . Itchy eyes 01/06/2006  . Menorrhagia 08/18/2009  . Nausea 10/09/2006  . 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    Current Outpatient Medications  Medication Sig Dispense Refill  . Multiple Vitamin (MULTIVITAMIN) tablet Take 1 tablet by mouth daily.    Marland Kitchen olmesartan (BENICAR) 20 MG tablet Take 1 tablet (20 mg total) by mouth daily. 90 tablet 1   No current facility-administered medications for this visit.    Allergies  Allergen Reactions  . Tramadol Other (See Comments)  . Sulfa Antibiotics     Family History  Problem Relation Age of Onset  . Hypertension Mother   . Cancer Neg Hx     Social History   Socioeconomic History  . Marital status: Married    Spouse name: Not on file  . Number of children: Not on file  . Years of education: Not on file  . Highest education level: Not on file  Occupational History  . Not on file  Tobacco Use  . Smoking status: Never Smoker  . Smokeless tobacco: Never Used  Substance and Sexual Activity  . Alcohol use: Not Currently    Comment: rare  . Drug use: No  . Sexual activity: Not on file  Other Topics Concern    . Not on file  Social History Narrative  . Not on file   Social Determinants of Health   Financial Resource Strain:   . Difficulty of Paying Living Expenses:   Food Insecurity:   . Worried About Charity fundraiser in the Last Year:   . Arboriculturist in the Last Year:   Transportation Needs:   . Film/video editor (Medical):   Marland Kitchen Lack of Transportation (Non-Medical):   Physical Activity:   . Days of Exercise per Week:   . Minutes of Exercise per Session:   Stress:   . Feeling of Stress :   Social Connections:   . Frequency of Communication with Friends and Family:   . Frequency of Social Gatherings with Friends and Family:   . Attends Religious Services:   . Active Member of Clubs or Organizations:   . Attends Archivist Meetings:   Marland Kitchen Marital Status:   Intimate Partner Violence:   . Fear of Current or Ex-Partner:   . Emotionally Abused:   Marland Kitchen Physically Abused:   . Sexually Abused:      Constitutional: Denies fever, malaise, fatigue, headache or abrupt weight changes.  HEENT: Denies eye pain, eye redness, ear pain, ringing in the ears, wax buildup, runny nose, nasal congestion, bloody nose, or sore throat. Respiratory: Denies difficulty breathing,  shortness of breath, cough or sputum production.   Cardiovascular: Denies chest pain, chest tightness, palpitations or swelling in the hands or feet.  Gastrointestinal: Denies abdominal pain, bloating, constipation, diarrhea or blood in the stool.  GU: Denies urgency, frequency, pain with urination, burning sensation, blood in urine, odor or discharge. Musculoskeletal: Denies decrease in range of motion, difficulty with gait, muscle pain or joint pain and swelling.  Skin: Denies redness, rashes, lesions or ulcercations.  Neurological: Denies dizziness, difficulty with memory, difficulty with speech or problems with balance and coordination.  Psych: Pt has a history of depression. Denies anxiety,  SI/HI.  No other  specific complaints in a complete review of systems (except as listed in HPI above).     Objective:   Physical Exam  BP 132/84 (BP Location: Left Arm, Patient Position: Sitting, Cuff Size: Normal)   Pulse 96   Temp (!) 97.3 F (36.3 C) (Temporal)   Ht 5\' 7"  (1.702 m)   Wt 247 lb (112 kg)   SpO2 99%   BMI 38.69 kg/m   Wt Readings from Last 3 Encounters:  12/18/19 242 lb (109.8 kg)  03/19/13 204 lb (92.5 kg)  01/14/13 202 lb 8 oz (91.9 kg)    General: Appears her stated age, obese, in NAD. Skin: Warm, dry and intact. Hypopigmented area noted on 2nd toe, right foot. HEENT: Head: normal shape and size; Eyes: sclera white, no icterus, conjunctiva pink, PERRLA and EOMs intact;  Neck:  Neck supple, trachea midline. No masses, lumps or thyromegaly present.  Cardiovascular: Normal rate and rhythm. S1,S2 noted.  No murmur, rubs or gallops noted. No JVD or BLE edema.  Pulmonary/Chest: Normal effort and positive vesicular breath sounds. No respiratory distress. No wheezes, rales or ronchi noted.  Abdomen: Soft and nontender. Normal bowel sounds. No distention or masses noted. Liver, spleen and kidneys non palpable. Musculoskeletal: Strength 5/5 BUE/BLE. No difficulty with gait.  Neurological: Alert and oriented. Cranial nerves II-XII grossly intact. Coordination normal.  Psychiatric: Mood and affect normal. Behavior is normal. Judgment and thought content normal.    BMET    Component Value Date/Time   NA 142 12/18/2019 1528   K 3.9 12/18/2019 1528   CL 107 (H) 12/18/2019 1528   CO2 23 12/18/2019 1528   GLUCOSE 85 12/18/2019 1528   GLUCOSE 92 01/14/2013 1538   BUN 10 12/18/2019 1528   CREATININE 0.77 12/18/2019 1528   CALCIUM 9.8 12/18/2019 1528   GFRNONAA 95 12/18/2019 1528   GFRAA 109 12/18/2019 1528    Lipid Panel     Component Value Date/Time   CHOL 155 01/28/2014 0854   TRIG 54 01/28/2014 0854   HDL 54 01/28/2014 0854   CHOLHDL 2.9 01/28/2014 0854   CHOLHDL 3  01/14/2013 1538   VLDL 18.0 01/14/2013 1538   LDLCALC 90 01/28/2014 0854    CBC    Component Value Date/Time   WBC 5.4 01/28/2014 0854   WBC 8.1 01/14/2013 1538   RBC 4.80 01/28/2014 0854   RBC 4.23 01/14/2013 1538   HGB 10.2 (L) 01/28/2014 0854   HCT 33.5 (L) 01/28/2014 0854   PLT 293.0 01/14/2013 1538   MCV 70 (L) 01/28/2014 0854   MCH 21.3 (L) 01/28/2014 0854   MCHC 30.4 (L) 01/28/2014 0854   MCHC 32.6 01/14/2013 1538   RDW 17.1 (H) 01/28/2014 0854   LYMPHSABS 2.2 01/28/2014 0854   EOSABS 0.1 01/28/2014 0854   BASOSABS 0.0 01/28/2014 0854    Hgb A1C Lab Results  Component Value Date   HGBA1C 5.6 01/14/2013            Assessment & Plan:   Preventative Health Maintenance:  Encouraged her to get a flu shot in the fall Tetanus UTD Pap smear UTD Encouraged her to consume a balanced diet and exercise regimen Advised her to see an eye doctor and dentist annually Will check CBC, CMET, Lipid, A1C and Vit D today  RTC in 1 year, sooner if needed Webb Silversmith, NP This visit occurred during the SARS-CoV-2 public health emergency.  Safety protocols were in place, including screening questions prior to the visit, additional usage of staff PPE, and extensive cleaning of exam room while observing appropriate contact time as indicated for disinfecting solutions.

## 2020-01-15 NOTE — Telephone Encounter (Signed)
Patient would like prescriptions sent to Inpatient pharmacy at [facility] in [facility] ([place] instead of [facility].

## 2020-01-16 ENCOUNTER — Encounter: Payer: Self-pay | Admitting: Internal Medicine

## 2020-01-16 LAB — COMPREHENSIVE METABOLIC PANEL
ALT: 20 IU/L (ref 0–32)
AST: 23 IU/L (ref 0–40)
Albumin/Globulin Ratio: 1.6 (ref 1.2–2.2)
Albumin: 3.9 g/dL (ref 3.8–4.8)
Alkaline Phosphatase: 73 IU/L (ref 39–117)
BUN/Creatinine Ratio: 17 (ref 9–23)
BUN: 12 mg/dL (ref 6–24)
Bilirubin Total: <0.2 mg/dL (ref 0.0–1.2)
CO2: 24 mmol/L (ref 20–29)
Calcium: 9.5 mg/dL (ref 8.7–10.2)
Chloride: 109 mmol/L — ABNORMAL HIGH (ref 96–106)
Creatinine, Ser: 0.69 mg/dL (ref 0.57–1.00)
GFR calc Af Amer: 123 mL/min/{1.73_m2} (ref >59)
GFR calc non Af Amer: 107 mL/min/{1.73_m2} (ref >59)
Globulin, Total: 2.5 g/dL (ref 1.5–4.5)
Glucose: 84 mg/dL (ref 65–99)
Potassium: 4.2 mmol/L (ref 3.5–5.2)
Sodium: 142 mmol/L (ref 134–144)
Total Protein: 6.4 g/dL (ref 6.0–8.5)

## 2020-01-16 LAB — LIPID PANEL
Chol/HDL Ratio: 2.8 ratio (ref 0.0–4.4)
Cholesterol, Total: 156 mg/dL (ref 100–199)
HDL: 55 mg/dL (ref >39)
LDL Chol Calc (NIH): 89 mg/dL (ref 0–99)
Triglycerides: 57 mg/dL (ref 0–149)
VLDL Cholesterol Cal: 12 mg/dL (ref 5–40)

## 2020-01-16 LAB — HEMOGLOBIN A1C
Est. average glucose Bld gHb Est-mCnc: 108 mg/dL
Hgb A1c MFr Bld: 5.4 % (ref 4.8–5.6)

## 2020-01-16 LAB — CBC
Hematocrit: 34.5 % (ref 34.0–46.6)
Hemoglobin: 10.5 g/dL — ABNORMAL LOW (ref 11.1–15.9)
MCH: 22.6 pg — ABNORMAL LOW (ref 26.6–33.0)
MCHC: 30.4 g/dL — ABNORMAL LOW (ref 31.5–35.7)
MCV: 74 fL — ABNORMAL LOW (ref 79–97)
Platelets: 239 10*3/uL (ref 150–450)
RBC: 4.65 x10E6/uL (ref 3.77–5.28)
RDW: 16.1 % — ABNORMAL HIGH (ref 11.7–15.4)
WBC: 9 10*3/uL (ref 3.4–10.8)

## 2020-01-16 LAB — VITAMIN D 25 HYDROXY (VIT D DEFICIENCY, FRACTURES): Vit D, 25-Hydroxy: 33.3 ng/mL (ref 30.0–100.0)

## 2020-01-16 MED ORDER — OLMESARTAN MEDOXOMIL 20 MG PO TABS: 20.0000 mg | ORAL_TABLET | Freq: Every day | ORAL | 1 refills | Status: DC

## 2020-01-16 NOTE — Assessment & Plan Note (Signed)
Currently not an issue Will monitor 

## 2020-01-16 NOTE — Assessment & Plan Note (Signed)
Reasonable control on Olmesartan Encouraged DASH diet and exercise for weight loss CMET today

## 2020-01-16 NOTE — Telephone Encounter (Addendum)
LVM for pt to call back. I did stated that I was not able to locate the pharmacy he wanted [place] to send it. Also, need to give pt lab results  After I left vm, I did find the pharmacy with that address.   If pt calls back - can you confirm if it is the [facility] (the address does match).   Pt may need to call the [facility] pharmacy and have them delete the refill that was sent in.   -does pt also want [place] to change his primary pharmacy to the [place]?

## 2020-01-16 NOTE — Patient Instructions (Signed)
Health Maintenance, Female Adopting a healthy lifestyle and getting preventive care are important in promoting health and wellness. Ask your health care provider about:  The right schedule for you to have regular tests and exams.  Things you can do on your own to prevent diseases and keep yourself healthy. What should I know about diet, weight, and exercise? Eat a healthy diet   Eat a diet that includes plenty of vegetables, fruits, low-fat dairy products, and lean protein.  Do not eat a lot of foods that are high in solid fats, added sugars, or sodium. Maintain a healthy weight Body mass index (BMI) is used to identify weight problems. It estimates body fat based on height and weight. Your health care provider can help determine your BMI and help you achieve or maintain a healthy weight. Get regular exercise Get regular exercise. This is one of the most important things you can do for your health. Most adults should:  Exercise for at least 150 minutes each week. The exercise should increase your heart rate and make you sweat (moderate-intensity exercise).  Do strengthening exercises at least twice a week. This is in addition to the moderate-intensity exercise.  Spend less time sitting. Even light physical activity can be beneficial. Watch cholesterol and blood lipids Have your blood tested for lipids and cholesterol at 44 years of age, then have this test every 5 years. Have your cholesterol levels checked more often if:  Your lipid or cholesterol levels are high.  You are older than 44 years of age.  You are at high risk for heart disease. What should I know about cancer screening? Depending on your health history and family history, you may need to have cancer screening at various ages. This may include screening for:  Breast cancer.  Cervical cancer.  Colorectal cancer.  Skin cancer.  Lung cancer. What should I know about heart disease, diabetes, and high blood  pressure? Blood pressure and heart disease  High blood pressure causes heart disease and increases the risk of stroke. This is more likely to develop in people who have high blood pressure readings, are of African descent, or are overweight.  Have your blood pressure checked: ? Every 3-5 years if you are 18-39 years of age. ? Every year if you are 40 years old or older. Diabetes Have regular diabetes screenings. This checks your fasting blood sugar level. Have the screening done:  Once every three years after age 40 if you are at a normal weight and have a low risk for diabetes.  More often and at a younger age if you are overweight or have a high risk for diabetes. What should I know about preventing infection? Hepatitis B If you have a higher risk for hepatitis B, you should be screened for this virus. Talk with your health care provider to find out if you are at risk for hepatitis B infection. Hepatitis C Testing is recommended for:  Everyone born from 1945 through 1965.  Anyone with known risk factors for hepatitis C. Sexually transmitted infections (STIs)  Get screened for STIs, including gonorrhea and chlamydia, if: ? You are sexually active and are younger than 44 years of age. ? You are older than 44 years of age and your health care provider tells you that you are at risk for this type of infection. ? Your sexual activity has changed since you were last screened, and you are at increased risk for chlamydia or gonorrhea. Ask your health care provider if   you are at risk.  Ask your health care provider about whether you are at high risk for HIV. Your health care provider may recommend a prescription medicine to help prevent HIV infection. If you choose to take medicine to prevent HIV, you should first get tested for HIV. You should then be tested every 3 months for as long as you are taking the medicine. Pregnancy  If you are about to stop having your period (premenopausal) and  you may become pregnant, seek counseling before you get pregnant.  Take 400 to 800 micrograms (mcg) of folic acid every day if you become pregnant.  Ask for birth control (contraception) if you want to prevent pregnancy. Osteoporosis and menopause Osteoporosis is a disease in which the bones lose minerals and strength with aging. This can result in bone fractures. If you are 65 years old or older, or if you are at risk for osteoporosis and fractures, ask your health care provider if you should:  Be screened for bone loss.  Take a calcium or vitamin D supplement to lower your risk of fractures.  Be given hormone replacement therapy (HRT) to treat symptoms of menopause. Follow these instructions at home: Lifestyle  Do not use any products that contain nicotine or tobacco, such as cigarettes, e-cigarettes, and chewing tobacco. If you need help quitting, ask your health care provider.  Do not use street drugs.  Do not share needles.  Ask your health care provider for help if you need support or information about quitting drugs. Alcohol use  Do not drink alcohol if: ? Your health care provider tells you not to drink. ? You are pregnant, may be pregnant, or are planning to become pregnant.  If you drink alcohol: ? Limit how much you use to 0-1 drink a day. ? Limit intake if you are breastfeeding.  Be aware of how much alcohol is in your drink. In the [place] one drink equals one 12 oz bottle of beer (355 mL), one 5 oz glass of wine (148 mL), or one 1 oz glass of hard liquor (44 mL). General instructions  Schedule regular health, dental, and eye exams.  Stay current with your vaccines.  Tell your health care provider if: ? You often feel depressed. ? You have ever been abused or do not feel safe at home. Summary  Adopting a healthy lifestyle and getting preventive care are important in promoting health and wellness.  Follow your health care provider's instructions about healthy  diet, exercising, and getting tested or screened for diseases.  Follow your health care provider's instructions on monitoring your cholesterol and blood pressure. This information is not intended to replace advice given to you by your health care provider. Make sure you discuss any questions you have with your health care provider. Document Revised: 08/15/2018 Document Reviewed: 08/15/2018 Elsevier Patient Education  2020 Elsevier Inc.  

## 2020-01-24 ENCOUNTER — Other Ambulatory Visit: Payer: Self-pay | Admitting: Obstetrics and Gynecology

## 2020-01-24 ENCOUNTER — Ambulatory Visit
Admission: RE | Admit: 2020-01-24 | Discharge: 2020-01-24 | Disposition: A | Discharge status: Home / Self Care | Payer: PRIVATE HEALTH INSURANCE | Source: Ambulatory Visit | Attending: Obstetrics and Gynecology | Admitting: Obstetrics and Gynecology

## 2020-01-24 DIAGNOSIS — T8332XA Displacement of intrauterine contraceptive device, initial encounter: Secondary | ICD-10-CM

## 2020-04-20 ENCOUNTER — Other Ambulatory Visit: Payer: Self-pay | Admitting: Obstetrics & Gynecology

## 2020-04-20 DIAGNOSIS — Z30431 Encounter for routine checking of intrauterine contraceptive device: Secondary | ICD-10-CM

## 2020-06-09 ENCOUNTER — Encounter: Payer: Self-pay | Admitting: Physician Assistant

## 2020-07-08 ENCOUNTER — Ambulatory Visit: Payer: PRIVATE HEALTH INSURANCE | Admitting: Physician Assistant

## 2020-08-05 ENCOUNTER — Other Ambulatory Visit (INDEPENDENT_AMBULATORY_CARE_PROVIDER_SITE_OTHER): Payer: 59

## 2020-08-05 ENCOUNTER — Encounter: Payer: Self-pay | Admitting: Physician Assistant

## 2020-08-05 ENCOUNTER — Ambulatory Visit (INDEPENDENT_AMBULATORY_CARE_PROVIDER_SITE_OTHER): Payer: Self-pay | Admitting: Physician Assistant

## 2020-08-05 VITALS — BP 112/76 | HR 110 | Ht 67.0 in | Wt 238.0 lb

## 2020-08-05 DIAGNOSIS — R109 Unspecified abdominal pain: Secondary | ICD-10-CM

## 2020-08-05 DIAGNOSIS — K219 Gastro-esophageal reflux disease without esophagitis: Secondary | ICD-10-CM | POA: Diagnosis not present

## 2020-08-05 DIAGNOSIS — R1012 Left upper quadrant pain: Secondary | ICD-10-CM

## 2020-08-05 LAB — IBC PANEL
Iron: 34 ug/dL — ABNORMAL LOW (ref 42–145)
Saturation Ratios: 8.6 % — ABNORMAL LOW (ref 20.0–50.0)
Transferrin: 281 mg/dL (ref 212.0–360.0)

## 2020-08-05 LAB — BASIC METABOLIC PANEL
BUN: 10 mg/dL (ref 6–23)
CO2: 28 mEq/L (ref 19–32)
Calcium: 9.3 mg/dL (ref 8.4–10.5)
Chloride: 108 mEq/L (ref 96–112)
Creatinine, Ser: 0.7 mg/dL (ref 0.40–1.20)
GFR: 105.21 mL/min (ref >60.00)
Glucose, Bld: 78 mg/dL (ref 70–99)
Potassium: 3.5 mEq/L (ref 3.5–5.1)
Sodium: 141 mEq/L (ref 135–145)

## 2020-08-05 LAB — FERRITIN: Ferritin: 5.1 ng/mL — ABNORMAL LOW (ref 10.0–291.0)

## 2020-08-05 MED ORDER — PANTOPRAZOLE SODIUM 40 MG PO TBEC: 40.0000 mg | DELAYED_RELEASE_TABLET | Freq: Every morning | ORAL | 11 refills | Status: DC

## 2020-08-05 NOTE — Progress Notes (Signed)
Agree with assessment and plan as outlined. Labs reviewed. She has a severe iron deficiency. Amy I recommend both EGD and colonoscopy to evaluate her symptoms and iron deficiency if you can add colonoscopy to her EGD. Thanks

## 2020-08-05 NOTE — Progress Notes (Signed)
Subjective:    Patient ID: Amanda [name], female    DOB: 06-24-1976, 44 y.o.   MRN: [id number]  HPI Amanda [name] is a pleasant 44 year old African-American female, new to GI today referred by Nancee Liter, PA/urgent care for evaluation of new abdominal pain. Patient has not had any prior GI evaluation.  She has history of hypertension, depression. She says her current symptoms have been present over the past 4 to 5 weeks.  She describes left upper and left mid abdominal pain which has been intermittent and sharp at times.  She is also been having some headaches.  Appetite has been okay, no weight loss.  No nausea or vomiting.  No changes in bowel habits no melena or hematochezia.  She does get intermittent heartburn and uses Nexium on a as needed basis.  No recent changes with the heartburn or increase since onset of the abdominal pain.  She does feel that the left upper abdominal pain is sometimes worse immediately postprandially.  She is not using any regular aspirin or NSAIDs. Family history is negative for GI disease as far she is aware. She did have labs done on 06/11/2020 showing WBC of 7.9, hemoglobin 810.1/hematocrit of 35.5, MCV of 75 liver tests were normal, lipase 54 and H. pylori breath testing was done and negative. Reviewing her labs she was also anemic 7 years ago with hemoglobin of 9 hematocrit of 27 and MCV of 65. She says she has been consistently anemic over the past 14 years or so since she had her last child.  She attributes this to menorrhagia and says she also has been told that she has fibroids. No recent abdominal imaging  Review of Systems Pertinent positive and negative review of systems were noted in the above HPI section.  All other review of systems was otherwise negative.  Outpatient Encounter Medications as of 08/05/2020  Medication Sig  . Esomeprazole Magnesium (NEXIUM PO) Take by mouth. Over the counter  . Multiple Vitamin (MULTIVITAMIN) tablet Take 1 tablet by mouth  daily.  Marland Kitchen olmesartan (BENICAR) 20 MG tablet Take 1 tablet (20 mg total) by mouth daily.  . pantoprazole (PROTONIX) 40 MG tablet Take 1 tablet (40 mg total) by mouth in the morning.   No facility-administered encounter medications on file as of 08/05/2020.   Allergies  Allergen Reactions  . Tramadol Other (See Comments)  . Sulfa Antibiotics    Patient Active Problem List   Diagnosis Date Noted  . Essential hypertension 03/13/2017  . Depression 11/15/2011   Social History   Socioeconomic History  . Marital status: Married    Spouse name: Not on file  . Number of children: Not on file  . Years of education: Not on file  . Highest education level: Not on file  Occupational History  . Not on file  Tobacco Use  . Smoking status: Never Smoker  . Smokeless tobacco: Never Used  Vaping Use  . Vaping Use: Never used  Substance and Sexual Activity  . Alcohol use: Yes    Comment: rare  . Drug use: No  . Sexual activity: Not on file  Other Topics Concern  . Not on file  Social History Narrative  . Not on file   Social Determinants of Health   Financial Resource Strain:   . Difficulty of Paying Living Expenses: Not on file  Food Insecurity:   . Worried About Charity fundraiser in the Last Year: Not on file  . Ran Out of Food in  the Last Year: Not on file  Transportation Needs:   . Lack of Transportation (Medical): Not on file  . Lack of Transportation (Non-Medical): Not on file  Physical Activity:   . Days of Exercise per Week: Not on file  . Minutes of Exercise per Session: Not on file  Stress:   . Feeling of Stress : Not on file  Social Connections:   . Frequency of Communication with Friends and Family: Not on file  . Frequency of Social Gatherings with Friends and Family: Not on file  . Attends Religious Services: Not on file  . Active Member of Clubs or Organizations: Not on file  . Attends Archivist Meetings: Not on file  . Marital Status: Not on file    Intimate Partner Violence:   . Fear of Current or Ex-Partner: Not on file  . Emotionally Abused: Not on file  . Physically Abused: Not on file  . Sexually Abused: Not on file    Amanda [name]'s family history includes Hypertension in her mother.      Objective:    Vitals:   08/05/20 1141  BP: 112/76  Pulse: (!) 110    Physical Exam Well-developed well-nourished  AA female  in no acute distress.  Height, HGDJME,268 BMI 37.2  HEENT; nontraumatic normocephalic, EOMI, PE RR LA, sclera anicteric. Oropharynx ;not done Neck; supple, no JVD Cardiovascular; regular rate and rhythm with S1-S2, no murmur rub or gallop Pulmonary; Clear bilaterally Abdomen; soft, , there is tenderness in the left upper quadrant, no guarding nondistended, no palpable mass or hepatosplenomegaly, bowel sounds are active Rectal; not done today Skin; benign exam, no jaundice rash or appreciable lesions Extremities; no clubbing cyanosis or edema skin warm and dry Neuro/Psych; alert and oriented x4, grossly nonfocal mood and affect appropriate       Assessment & Plan:   #11 44 year old African-American female with 4 to 5-week history of left upper quadrant/left mid quadrant pain, intermittent and sharp in nature sometimes exacerbated by p.o. intake. Etiology is not clear, rule out possible gastropathy, peptic ulcer disease, occult intra-abdominal inflammatory process or neoplasm.  #2 obesity #3.  Hypertension #4  Chronic microcytic anemia-probably secondary to menorrhagia, rule out iron deficiency, consider thalassemia #5  History of depression  Plan; start pantoprazole 40 mg p.o. every morning Patient will be scheduled for CT scan of the abdomen and pelvis with contrast. We also discussed EGD, and patient initially had wanted to proceed with scheduling for both, then later decided to proceed with CT scan first. She will be established with Dr. Havery Moros. We will check be met and iron studies  today Further recommendations pending results of CT.  If CT negative will proceed to schedule EGD, as next step.Marland Kitchen  Leeman Johnsey Genia Harold PA-C 08/05/2020   Cc: Jearld Fenton, NP

## 2020-08-05 NOTE — Patient Instructions (Signed)
If you are age 44 or older, your body mass index should be between 23-30. Your Body mass index is 37.28 kg/m. If this is out of the aforementioned range listed, please consider follow up with your Primary Care Provider.  If you are age 75 or younger, your body mass index should be between 19-25. Your Body mass index is 37.28 kg/m. If this is out of the aformentioned range listed, please consider follow up with your Primary Care Provider.   Your provider has requested that you go to the basement level for lab work before leaving today. Press "B" on the elevator. The lab is located at the first door on the left as you exit the elevator.  It has been recommended to you by your physician that you have a(n) Endoscopy completed. Per your request, we did not schedule the procedure(s) today. Please contact our office at [phone] should you decide to have the procedure completed. You will be scheduled for a pre-visit and procedure at that time.   You have been scheduled for a CT scan of the abdomen and pelvis at [facility] ([address]---this is in the same building as [employer]).   You are scheduled on ______ at _______. You should arrive 15 minutes prior to your appointment time for registration. Please follow the written instructions below on the day of your exam:  WARNING: IF YOU ARE ALLERGIC TO IODINE/X-RAY DYE, PLEASE NOTIFY RADIOLOGY IMMEDIATELY AT [phone]! YOU WILL BE GIVEN A 13 HOUR PREMEDICATION PREP.  1) Do not eat or drink anything after _______ (4 hours prior to your test) 2) You have been given 2 bottles of oral contrast to drink. The solution may taste better if refrigerated, but do NOT add ice or any other liquid to this solution. Shake well before drinking.    Drink 1 bottle of contrast @ _______ (2 hours prior to your exam)  Drink 1 bottle of contrast @ _______ (1 hour prior to your exam)  You may take any medications as prescribed with a small  amount of water, if necessary. If you take any of the following medications: METFORMIN, GLUCOPHAGE, GLUCOVANCE, AVANDAMET, RIOMET, FORTAMET, [place] MET, JANUMET, GLUMETZA or METAGLIP, you MAY be asked to HOLD this medication 48 hours AFTER the exam.  The purpose of you drinking the oral contrast is to aid in the visualization of your intestinal tract. The contrast solution may cause some diarrhea. Depending on your individual set of symptoms, you may also receive an intravenous injection of x-ray contrast/dye. Plan on being at [facility] for 30 minutes or longer, depending on the type of exam you are having performed.  This test typically takes 30-45 minutes to complete.  If you have any questions regarding your exam or if you need to reschedule, you may call the CT department at [phone] between the hours of 8:00 am and 5:00 pm, Monday-Friday.  ________________________________________________________________________   START Pantoprazole 40 mg 1 tablet every morning.  Follow up pending the results of your labs, CT and Endoscopy.  Thank you for entrusting me with your care and choosing [facility].  Amy Esterwood, PA-C

## 2020-08-07 NOTE — Progress Notes (Signed)
Amanda [name] - pt actually decided  on the day of office visit not to schedule the EGD- she wants to do CT first- I did talk with her about her anemia during office visit and she is adamant that she has been anemic at least as far back as birth of last child in 2007 - looking at labs from 2014 - hgb was 9.0 She had hgb 5.8 after delivery of child with hemorrhage   She says anemia is from menorrhagia    I will try to get copies of GYN notes from past 10 years or so . Once we get result of CT I can discuss further work up with her

## 2020-08-07 NOTE — Progress Notes (Signed)
Okay thanks for clarifying, will await CT first. Thanks

## 2020-08-11 ENCOUNTER — Telehealth: Payer: Self-pay

## 2020-08-11 NOTE — Telephone Encounter (Signed)
-----   Message from Alfredia Ferguson, PA-C sent at 08/07/2020 12:30 PM EST ----- Regarding: GYN notes/ labs DR Rivard, Dr Leo Grosser Please see if GYn practice  will send copies of notes and labs from past 10 years or so - trying to determine if her iron deficency anemia is ftom menorrhagia  Thanks

## 2020-08-11 NOTE — Telephone Encounter (Signed)
Called the patient. No answer. Left a voicemail asking her to come by to sign a release of information as required by HIPPA. Form at the front desk. Staff aware that she will need to supply the name of her OB-GYN provider as well.

## 2020-08-12 ENCOUNTER — Ambulatory Visit (INDEPENDENT_AMBULATORY_CARE_PROVIDER_SITE_OTHER)
Admission: RE | Admit: 2020-08-12 | Discharge: 2020-08-12 | Disposition: A | Discharge status: Home / Self Care | Payer: 59 | Source: Ambulatory Visit | Attending: Physician Assistant | Admitting: Physician Assistant

## 2020-08-12 ENCOUNTER — Encounter: Payer: PRIVATE HEALTH INSURANCE | Admitting: Internal Medicine

## 2020-08-12 ENCOUNTER — Other Ambulatory Visit: Payer: Self-pay

## 2020-08-12 DIAGNOSIS — K219 Gastro-esophageal reflux disease without esophagitis: Secondary | ICD-10-CM

## 2020-08-12 DIAGNOSIS — R109 Unspecified abdominal pain: Secondary | ICD-10-CM

## 2020-08-12 DIAGNOSIS — R1012 Left upper quadrant pain: Secondary | ICD-10-CM | POA: Diagnosis not present

## 2020-08-12 MED ORDER — IOHEXOL 300 MG/ML  SOLN
100.0000 mL | Freq: Once | INTRAMUSCULAR | Status: AC | PRN
  Administered 2020-08-12: 100 mL via INTRAVENOUS

## 2020-09-13 ENCOUNTER — Emergency Department (HOSPITAL_COMMUNITY): Payer: 59

## 2020-09-13 ENCOUNTER — Encounter (HOSPITAL_COMMUNITY): Payer: Self-pay | Admitting: Emergency Medicine

## 2020-09-13 ENCOUNTER — Other Ambulatory Visit: Payer: Self-pay

## 2020-09-13 ENCOUNTER — Emergency Department (HOSPITAL_COMMUNITY)
Admission: EM | Admit: 2020-09-13 | Discharge: 2020-09-13 | Disposition: A | Discharge status: Home / Self Care | Payer: 59 | Attending: Emergency Medicine | Admitting: Emergency Medicine

## 2020-09-13 DIAGNOSIS — S61412A Laceration without foreign body of left hand, initial encounter: Secondary | ICD-10-CM | POA: Insufficient documentation

## 2020-09-13 DIAGNOSIS — S6992XA Unspecified injury of left wrist, hand and finger(s), initial encounter: Secondary | ICD-10-CM | POA: Diagnosis present

## 2020-09-13 DIAGNOSIS — W260XXA Contact with knife, initial encounter: Secondary | ICD-10-CM | POA: Insufficient documentation

## 2020-09-13 DIAGNOSIS — Z23 Encounter for immunization: Secondary | ICD-10-CM | POA: Diagnosis not present

## 2020-09-13 MED ORDER — IBUPROFEN 600 MG PO TABS: 600.0000 mg | ORAL_TABLET | Freq: Three times a day (TID) | ORAL | 0 refills | Status: AC

## 2020-09-13 MED ORDER — TETANUS-DIPHTH-ACELL PERTUSSIS 5-2.5-18.5 LF-MCG/0.5 IM SUSY
0.5000 mL | PREFILLED_SYRINGE | Freq: Once | INTRAMUSCULAR | Status: AC
  Administered 2020-09-13: 0.5 mL via INTRAMUSCULAR
  Filled 2020-09-13: qty 0.5

## 2020-09-13 MED ORDER — LIDOCAINE HCL (PF) 1 % IJ SOLN
5.0000 mL | Freq: Once | INTRAMUSCULAR | Status: AC
  Administered 2020-09-13: 5 mL
  Filled 2020-09-13: qty 5

## 2020-09-13 MED ORDER — BACITRACIN ZINC 500 UNIT/GM EX OINT
TOPICAL_OINTMENT | Freq: Two times a day (BID) | CUTANEOUS | Status: DC
  Filled 2020-09-13: qty 0.9

## 2020-09-13 NOTE — ED Notes (Signed)
Patient discharge instructions reviewed with the patient. The patient verbalized understanding of instructions. Patient discharged. 

## 2020-09-13 NOTE — ED Provider Notes (Addendum)
[facility] Provider Note   CSN: [id number] Arrival date & time: 09/13/20  1351     History Chief Complaint  Patient presents with  . Laceration    Amanda [name] is a 45 y.o. female arrives to the ER today for laceration occurred around 30 minutes prior to arrival.  Patient was cutting a coconut with a knife, she slipped excellently stabbed herself and the left palm she reports immediate onset sharp pain moderate intensity nonradiating worsened with palpation improved with rest.  Reports bleeding was immediate she controlled with direct pressure.  She does not know when her last tetanus shot was.  Denies any numbness/weakness, tingling, pain in joint movement or any additional concerns.  Of note patient denies chance of pregnancy today.  Patient states understanding that medications given or prescribed today may result in harm to of a pregnancy and she accepts these risks and still chooses not to be pregnancy tested and proceed with medications.  HPI     Past Medical History:  Diagnosis Date  . Anemia 10/09/2006  . Depression   . Insomnia 10/09/2006  . Itchy eyes 01/06/2006  . Menorrhagia 08/18/2009  . Nausea 10/09/2006  . 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    Patient Active Problem List   Diagnosis Date Noted  . Essential hypertension 03/13/2017  . Depression 11/15/2011    Past Surgical History:  Procedure Laterality Date  . none       OB History   No obstetric history on file.     Family History  Problem Relation Age of Onset  . Hypertension Mother   . Cancer Neg Hx   . Colon cancer Neg Hx   . Stomach cancer Neg Hx   . Esophageal cancer Neg Hx   . Pancreatic cancer Neg Hx     Social History   Tobacco Use  . Smoking status: Never Smoker  . Smokeless tobacco: Never Used  Vaping Use  . Vaping Use: Never used  Substance Use Topics  . Alcohol use: Yes    Comment: rare  . Drug use: No    Home Medications Prior to  Admission medications   Medication Sig Start Date End Date Taking? Authorizing Provider  Ferrous Sulfate (IRON PO) Take 1 tablet by mouth daily.   Yes [provider]  Multiple Vitamin (MULTIVITAMIN) tablet Take 1 tablet by mouth daily.   Yes [provider]  pantoprazole (PROTONIX) 40 MG tablet Take 1 tablet (40 mg total) by mouth in the morning. 08/05/20  Yes Esterwood, Amy S, PA-C  polyvinyl alcohol (LIQUIFILM TEARS) 1.4 % ophthalmic solution Place 1 drop into both eyes as needed for dry eyes.   Yes [provider]  olmesartan (BENICAR) 20 MG tablet Take 1 tablet (20 mg total) by mouth daily. 01/16/20   Jearld Fenton, NP    Allergies    Tramadol and Sulfa antibiotics  Review of Systems   Review of Systems  Constitutional: Negative.  Negative for chills and fever.  Musculoskeletal: Negative.  Negative for arthralgias.  Skin: Positive for wound. Negative for color change.  Neurological: Negative.  Negative for weakness and numbness.    Physical Exam Updated Vital Signs BP (!) 149/91 (BP Location: Right Arm)   Pulse 76   Temp 98.4 F (36.9 C) (Oral)   Resp 16   SpO2 100%   Physical Exam Constitutional:      General: She is not in acute distress.    Appearance: Normal appearance. She  is well-developed. She is not ill-appearing or diaphoretic.  HENT:     Head: Normocephalic and atraumatic.  Eyes:     General: Vision grossly intact. Gaze aligned appropriately.     Pupils: Pupils are equal, round, and reactive to light.  Neck:     Trachea: Trachea and phonation normal.  Cardiovascular:     Rate and Rhythm: Normal rate and regular rhythm.     Pulses:          Radial pulses are 2+ on the right side and 2+ on the left side.  Pulmonary:     Effort: Pulmonary effort is normal. No respiratory distress.  Abdominal:     General: There is no distension.     Palpations: Abdomen is soft.     Tenderness: There is no abdominal tenderness. There is no  guarding or rebound.  Musculoskeletal:        General: Normal range of motion.       Hands:     Cervical back: Normal range of motion.     Comments: Left hand: 1 cm laceration of left thenar eminence.  No gross deformities. Fingers appear normal.  Tenderness to palpation directly over laceration. No snuffbox tenderness to palpation. No tenderness to palpation over flexor sheath.  Finger adduction/abduction intact with 5/5 strength.  Thumb opposition intact. Full active and resisted ROM to flexion/extension at wrist, MCP, PIP and DIP of all fingers.  FDS/FDP intact. Grip 5/5 strength.  Radial artery 2+ with <2sec cap refill in all fingers.  Sensation intact to light-tough in median/ulnar/radial distributions.  Skin:    General: Skin is warm and dry.  Neurological:     Mental Status: She is alert.     GCS: GCS eye subscore is 4. GCS verbal subscore is 5. GCS motor subscore is 6.     Comments: Speech is clear and goal oriented, follows commands Major Cranial nerves without deficit, no facial droop Moves extremities without ataxia, coordination intact  Psychiatric:        Behavior: Behavior normal.     ED Results / Procedures / Treatments   Labs (all labs ordered are listed, but only abnormal results are displayed) Labs Reviewed - No data to display  EKG None  Radiology DG Hand Complete Left  Result Date: 09/13/2020 CLINICAL DATA:  Accidental laceration to LEFT hand. EXAM: LEFT HAND - COMPLETE 3+ VIEW COMPARISON:  None. FINDINGS: Osseous alignment is normal. No fracture line or displaced fracture fragment is seen. No acute appearing cortical irregularity or osseous lesion. No radiodense foreign body is seen within the soft tissues. IMPRESSION: Negative. No osseous fracture or dislocation. No foreign body within the soft tissues. Electronically Signed   By: Franki Cabot M.D.   On: 09/13/2020 15:06    Procedures .Marland KitchenLaceration Repair  Date/Time: 09/13/2020 5:36 PM Performed by:  Deliah Boston, PA-C Authorized by: Deliah Boston, PA-C   Consent:    Consent obtained:  Verbal   Consent given by:  Patient   Risks, benefits, and alternatives were discussed: yes     Risks discussed:  Infection, need for additional repair, nerve damage, poor wound healing, pain, poor cosmetic result, tendon damage, vascular damage and retained foreign body Universal protocol:    Procedure explained and questions answered to patient or proxy's satisfaction: yes     Relevant documents present and verified: yes     Imaging studies available: yes     Required blood products, implants, devices, and special equipment available: yes  Site/side marked: yes     Immediately prior to procedure, a time out was called: yes     Patient identity confirmed:  Verbally with patient Anesthesia:    Anesthesia method:  Local infiltration   Local anesthetic:  Lidocaine 1% w/o epi Laceration details:    Location:  Hand   Hand location:  L palm   Length (cm):  1   Depth (mm):  7 Pre-procedure details:    Preparation:  Patient was prepped and draped in usual sterile fashion and imaging obtained to evaluate for foreign bodies Exploration:    Limited defect created (wound extended): no     Hemostasis achieved with:  Direct pressure   Imaging obtained: x-ray     Imaging outcome: foreign body not noted     Wound exploration: wound explored through full range of motion and entire depth of wound visualized     Wound extent: no foreign bodies/material noted, no muscle damage noted, no nerve damage noted, no tendon damage noted, no underlying fracture noted and no vascular damage noted     Contaminated: no   Treatment:    Area cleansed with:  Povidone-iodine   Amount of cleaning:  Standard   Irrigation solution:  Sterile saline   Irrigation method:  Pressure wash   Debridement:  None   Undermining:  None Skin repair:    Repair method:  Sutures   Suture size:  4-0   Suture material:   Prolene   Suture technique:  Simple interrupted   Number of sutures:  3 Approximation:    Approximation:  Close Repair type:    Repair type:  Simple Post-procedure details:    Dressing:  Antibiotic ointment, non-adherent dressing and sterile dressing   Procedure completion:  Tolerated well, no immediate complications Comments:     Dressing by nursing staff.   (including critical care time)  Medications Ordered in ED Medications  Tdap (BOOSTRIX) injection 0.5 mL (has no administration in time range)  bacitracin ointment (has no administration in time range)  lidocaine (PF) (XYLOCAINE) 1 % injection 5 mL (5 mLs Infiltration Given 09/13/20 1630)    ED Course  I have reviewed the triage vital signs and the nursing notes.  Pertinent labs & imaging results that were available during my care of the patient were reviewed by me and considered in my medical decision making (see chart for details).    MDM Rules/Calculators/A&P                         Additional history obtained from: 1. Nursing notes from this visit. --------------  Amanda [name] is a 45 y.o. female who presents to ED for laceration of the Left Palm.  Neurovascular intact no neurologic complaint, no other injuries.  Strength and sensation and range of motion of the hand within normal limits.  DG Left Hand:  IMPRESSION:  Negative. No osseous fracture or dislocation. No foreign body within  the soft tissues.   Tdap up to date updated today.  Wound thoroughly cleaned in ED today. Wound explored and bottom of wound seen in a bloodless field. Laceration repaired as dictated above.   No antibiotics indicated at this time  Patient counseled on home wound care. Follow up with PCP/urgent care or return to ER for suture removal in 10 days. Patient was urged to return to the Emergency Department for worsening pain, swelling, expanding erythema especially if it streaks away from the affected area, fever, or for  any additional  concerns.  As laceration was on the hand referral was given to hand specialist.  At this time there does not appear to be any evidence of an acute emergency medical condition and the patient appears stable for discharge with appropriate outpatient follow up. Diagnosis was discussed with patient who verbalizes understanding of care plan and is agreeable to discharge. I have discussed return precautions with patient who verbalizes understanding. Patient encouraged to follow-up with their PCP. All questions answered. -- At discharge patient requesting ibuprofen prescription.  She was prescribed 600 mg ibuprofen 3 times daily x3 days.  No history of CKD/gastric ulcers  Note: Portions of this report may have been transcribed using voice recognition software. Every effort was made to ensure accuracy; however, inadvertent computerized transcription errors may still be present. Final Clinical Impression(s) / ED Diagnoses Final diagnoses:  Laceration of left hand without foreign body, initial encounter    Rx / DC Orders ED Discharge Orders    None       Deliah Boston, PA-C 09/13/20 1743    Gari Crown 09/13/20 1804    Tegeler, Gwenyth Allegra, MD 09/13/20 2127

## 2020-09-13 NOTE — Discharge Instructions (Addendum)
At this time there does not appear to be the presence of an emergent medical condition, however there is always the potential for conditions to change. Please read and follow the below instructions.  Please return to the Emergency Department immediately for any new or worsening symptoms. Please be sure to follow up with your Primary Care Provider within one week regarding your visit today; please call their office to schedule an appointment even if you are feeling better for a follow-up visit. You may continue use a small mount of antibiotic ointment on your cut for the next few days.  You may rinse the area gently with clean soapy water but do not soak the area.  You will need to get your stitches removed in 10 days.  They may be removed by an urgent care, your primary care doctor, the hand specialist or here the emergency department. You have been given referral to the hand specialist Dr. Tamera Punt, you may call his office schedule follow-up appointment if needed.  Go to the nearest Emergency Department immediately if: You have fever or chills You have very bad swelling around the wound. Your pain suddenly gets worse and is very bad. You notice painful lumps near the wound or anywhere on your body. You have a red streak going away from your wound. The wound is on your hand or foot, and: You cannot move a finger or toe. Your fingers or toes look pale or bluish. You have any new/concerning or worsening of symptoms.   Please read the additional information packets attached to your discharge summary.  Do not take your medicine if  develop an itchy rash, swelling in your mouth or lips, or difficulty breathing; call 911 and seek immediate emergency medical attention if this occurs.  You may review your lab tests and imaging results in their entirety on your MyChart account.  Please discuss all results of fully with your primary care provider and other specialist at your follow-up visit.  Note:  Portions of this text may have been transcribed using voice recognition software. Every effort was made to ensure accuracy; however, inadvertent computerized transcription errors may still be present.

## 2020-09-13 NOTE — ED Triage Notes (Signed)
Approx 1/2-1 inch laceration to L palm x 30 min.  States knife broke while she was cutting a pineapple.  Bleeding controlled.

## 2020-09-17 ENCOUNTER — Ambulatory Visit (INDEPENDENT_AMBULATORY_CARE_PROVIDER_SITE_OTHER): Payer: 59 | Admitting: Internal Medicine

## 2020-09-17 ENCOUNTER — Encounter: Payer: Self-pay | Admitting: Internal Medicine

## 2020-09-17 ENCOUNTER — Other Ambulatory Visit: Payer: Self-pay

## 2020-09-17 VITALS — BP 144/92 | HR 74 | Temp 97.9°F | Wt 238.0 lb

## 2020-09-17 DIAGNOSIS — S61412D Laceration without foreign body of left hand, subsequent encounter: Secondary | ICD-10-CM | POA: Diagnosis not present

## 2020-09-17 DIAGNOSIS — I10 Essential (primary) hypertension: Secondary | ICD-10-CM

## 2020-09-17 MED ORDER — AMLODIPINE BESYLATE 10 MG PO TABS: 10.0000 mg | ORAL_TABLET | Freq: Every day | ORAL | 0 refills | Status: DC

## 2020-09-17 NOTE — Progress Notes (Signed)
Subjective:    Patient ID: Amanda [name], female    DOB: 1976-06-17, 45 y.o.   MRN: [id number]  HPI  Patient presents to the clinic today for ER follow-up.  She presented to the ER 1/9 after sustaining a laceration to her left arm while trying to cut a coconut.  X-ray of the left hand showed no acute findings.  Her wound was cleansed and repaired with 3 sutures.  She did receive a tetanus injection.  She was discharged and advised to follow-up with her PCP in 1 week for suture removal.  She also reports she stopped Olmesartan due to unwanted side effects of headache, muscle aches and abdominal pain. She reports these symptoms resolved after stopping the medication.  Her BP today is 144/92.  Review of Systems      Past Medical History:  Diagnosis Date  . Anemia 10/09/2006  . Depression   . Insomnia 10/09/2006  . Itchy eyes 01/06/2006  . Menorrhagia 08/18/2009  . Nausea 10/09/2006  . 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    Current Outpatient Medications  Medication Sig Dispense Refill  . Ferrous Sulfate (IRON PO) Take 1 tablet by mouth daily.    . Multiple Vitamin (MULTIVITAMIN) tablet Take 1 tablet by mouth daily.    Marland Kitchen olmesartan (BENICAR) 20 MG tablet Take 1 tablet (20 mg total) by mouth daily. 90 tablet 1  . pantoprazole (PROTONIX) 40 MG tablet Take 1 tablet (40 mg total) by mouth in the morning. 30 tablet 11  . polyvinyl alcohol (LIQUIFILM TEARS) 1.4 % ophthalmic solution Place 1 drop into both eyes as needed for dry eyes.     No current facility-administered medications for this visit.    Allergies  Allergen Reactions  . Tramadol Other (See Comments)  . Sulfa Antibiotics     Family History  Problem Relation Age of Onset  . Hypertension Mother   . Cancer Neg Hx   . Colon cancer Neg Hx   . Stomach cancer Neg Hx   . Esophageal cancer Neg Hx   . Pancreatic cancer Neg Hx     Social History   Socioeconomic History  . Marital status: Married    Spouse name: Not on file   . Number of children: Not on file  . Years of education: Not on file  . Highest education level: Not on file  Occupational History  . Not on file  Tobacco Use  . Smoking status: Never Smoker  . Smokeless tobacco: Never Used  Vaping Use  . Vaping Use: Never used  Substance and Sexual Activity  . Alcohol use: Yes    Comment: rare  . Drug use: No  . Sexual activity: Not on file  Other Topics Concern  . Not on file  Social History Narrative  . Not on file   Social Determinants of Health   Financial Resource Strain: Not on file  Food Insecurity: Not on file  Transportation Needs: Not on file  Physical Activity: Not on file  Stress: Not on file  Social Connections: Not on file  Intimate Partner Violence: Not on file     Constitutional: Denies fever, malaise, fatigue, headache or abrupt weight changes.  Respiratory: Denies difficulty breathing, shortness of breath, cough or sputum production.   Cardiovascular: Denies chest pain, chest tightness, palpitations or swelling in the hands or feet.  Musculoskeletal: Denies decrease in range of motion, difficulty with gait, muscle pain or joint pain and swelling.  Skin: Pt reports laceration to left palm. Denies  redness, rashes, lesions or ulcercations.  Neurological: Denies dizziness, difficulty with memory, difficulty with speech or problems with balance and coordination.    No other specific complaints in a complete review of systems (except as listed in HPI above).  Objective:   Physical Exam   BP (!) 144/92   Pulse 74   Temp 97.9 F (36.6 C) (Temporal)   Wt 238 lb (108 kg)   SpO2 99%   BMI 37.28 kg/m   Wt Readings from Last 3 Encounters:  08/05/20 238 lb (108 kg)  01/15/20 247 lb (112 kg)  12/18/19 242 lb (109.8 kg)    General: Appears her stated age, obese in NAD. Skin: Warm, dry and intact. 1 cm laceration to left palm. HEENT: Head: normal shape and size; Eyes: sclera white, no icterus, conjunctiva pink,  PERRLA and EOMs intact;  Cardiovascular: Normal rate. Pulmonary/Chest: Normal effort. Neurological: Alert and oriented.    BMET    Component Value Date/Time   NA 141 08/05/2020 1229   NA 142 01/15/2020 1528   K 3.5 08/05/2020 1229   CL 108 08/05/2020 1229   CO2 28 08/05/2020 1229   GLUCOSE 78 08/05/2020 1229   BUN 10 08/05/2020 1229   BUN 12 01/15/2020 1528   CREATININE 0.70 08/05/2020 1229   CALCIUM 9.3 08/05/2020 1229   GFRNONAA 107 01/15/2020 1528   GFRAA 123 01/15/2020 1528    Lipid Panel     Component Value Date/Time   CHOL 156 01/15/2020 1528   TRIG 57 01/15/2020 1528   HDL 55 01/15/2020 1528   CHOLHDL 2.8 01/15/2020 1528   CHOLHDL 3 01/14/2013 1538   VLDL 18.0 01/14/2013 1538   LDLCALC 89 01/15/2020 1528    CBC    Component Value Date/Time   WBC 9.0 01/15/2020 1528   WBC 8.1 01/14/2013 1538   RBC 4.65 01/15/2020 1528   RBC 4.23 01/14/2013 1538   HGB 10.5 (L) 01/15/2020 1528   HCT 34.5 01/15/2020 1528   PLT 239 01/15/2020 1528   MCV 74 (L) 01/15/2020 1528   MCH 22.6 (L) 01/15/2020 1528   MCHC 30.4 (L) 01/15/2020 1528   MCHC 32.6 01/14/2013 1538   RDW 16.1 (H) 01/15/2020 1528   LYMPHSABS 2.2 01/28/2014 0854   EOSABS 0.1 01/28/2014 0854   BASOSABS 0.0 01/28/2014 0854    Hgb A1C Lab Results  Component Value Date   HGBA1C 5.4 01/15/2020           Assessment & Plan:   Laceration of Left Palm:  ER notes and imaging reviewed Too soon for suture removal. She will need to return Monday for suture removal. Advised her to keep open to air to allow this to dry   HTN:  Uncontrolled off meds RX for Amlodipine 10 daily Reinforced DASH diet and exercise for weight loss  RTC in 2 weeks for nurse visit BP check  Webb Silversmith, NP This visit occurred during the SARS-CoV-2 public health emergency.  Safety protocols were in place, including screening questions prior to the visit, additional usage of staff PPE, and extensive cleaning of exam room  while observing appropriate contact time as indicated for disinfecting solutions.

## 2020-09-17 NOTE — Patient Instructions (Signed)
Laceration Care, Adult A laceration is a cut that may go through all layers of the skin. The cut may also go into the tissue that is right under the skin. Some cuts heal on their own. Others need to be closed with stitches (sutures), staples, skin adhesive strips, or skin glue. Taking care of your injury lowers your risk of infection, helps your injury to heal better, and may prevent scarring. Supplies needed:  Soap.  Water.  Hand sanitizer.  Bandage (dressing).  Antibiotic ointment.  Clean towel. How to take care of your cut Wash your hands with soap and water before touching your wound or changing your bandage. If soap and water are not available, use hand sanitizer. If your doctor used stitches or staples:  Keep the wound clean and dry.  If you were given a bandage, change it at least once a day as told by your doctor. You should also change it if it gets wet or dirty.  Keep the wound completely dry for the first 24 hours, or as told by your doctor. After that, you may take a shower or a bath. Do not get the wound soaked in water until after the stitches or staples have been removed.  Clean the wound once a day, or as told by your doctor: ? Wash the wound with soap and water. ? Rinse the wound with water to remove all soap. ? Pat the wound dry with a clean towel. Do not rub the wound.  After you clean the wound, put a thin layer of antibiotic ointment on it as told by your doctor. This ointment: ? Helps to prevent infection. ? Keeps the bandage from sticking to the wound.  Have your stitches or staples removed as told by your doctor. If your doctor used skin adhesive strips:  Keep the wound clean and dry.  If you were given a bandage, you should change it at least once a day as told by your doctor. You should also change it if it gets wet or dirty.  Do not get the skin adhesive strips wet. You can take a shower or a bath, but keep the wound dry.  If the wound gets wet,  pat it dry with a clean towel. Do not rub the wound.  Skin adhesive strips fall off on their own. You can trim the strips as the wound heals. Do not remove any strips that are still stuck to the wound. They will fall off after a while. If your doctor used skin glue:  Try to keep your wound dry, but you may briefly wet it in the shower or bath. Do not soak the wound in water, such as by swimming.  After you take a shower or a bath, gently pat the wound dry with a clean towel. Do not rub the wound.  Do not do any activities that will make you really sweaty until the skin glue has fallen off on its own.  Do not apply liquid, cream, or ointment medicine to your wound while the skin glue is still on.  If you were given a bandage, you should change it at least once a day or as told by your doctor. You should also change it if it gets dirty or wet.  If a bandage is placed over the wound, do not let the tape touch the skin glue.  Do not pick at the glue. The skin glue usually stays on for 5-10 days. Then, it falls off the skin. General   instructions  Take over-the-counter and prescription medicines only as told by your doctor.  If you were given antibiotic medicine or ointment, take or apply it as told by your doctor. Do not stop using it even if your condition improves.  Do not scratch or pick at the wound.  Check your wound every day for signs of infection. Watch for: ? Redness, swelling, or pain. ? Fluid, blood, or pus.  Raise (elevate) the injured area above the level of your heart while you are sitting or lying down.  If directed, put ice on the affected area: ? Put ice in a plastic bag. ? Place a towel between your skin and the bag. ? Leave the ice on for 20 minutes, 2-3 times a day.  Prevent scarring by covering your wound with sunscreen of at least 30 SPF whenever you are outside after your wound has healed.  Keep all follow-up visits as told by your doctor. This is important.    Get help if:  You got a tetanus shot and you have any of these problems at the injection site: ? Swelling. ? Very bad pain. ? Redness. ? Bleeding.  You have a fever.  A wound that was closed breaks open.  You notice a bad smell coming from your wound or your bandage.  You notice something coming out of the wound, such as wood or glass.  Medicine does not relieve your pain.  You have more redness, swelling, or pain at the site of your wound.  You have fluid, blood, or pus coming from your wound.  You notice a change in the color of your skin near your wound.  You need to change the bandage often because fluid, blood, or pus is coming from the wound.  You start to have a new rash.  You start to have numbness around the wound. Get help right away if:  You have very bad swelling around the wound.  Your pain suddenly gets worse and is very bad.  You notice painful lumps near the wound or anywhere on your body.  You have a red streak going away from your wound.  The wound is on your hand or foot, and: ? You cannot move a finger or toe. ? Your fingers or toes look pale or bluish. Summary  A laceration is a cut that may go through all layers of the skin. The cut may also go into the tissue right under the skin.  Some cuts heal on their own. Others need to be closed with stitches, staples, skin adhesive strips, or skin glue.  Follow your doctor's instructions for caring for your cut. Proper care of a cut lowers the risk of infection, helps the cut heal better, and prevents scarring. This information is not intended to replace advice given to you by your health care provider. Make sure you discuss any questions you have with your health care provider. Document Revised: 10/20/2017 Document Reviewed: 09/11/2017 Elsevier Patient Education  2021 Elsevier Inc.  

## 2020-10-02 ENCOUNTER — Other Ambulatory Visit: Payer: Self-pay

## 2020-10-02 DIAGNOSIS — N2889 Other specified disorders of kidney and ureter: Secondary | ICD-10-CM

## 2020-10-09 ENCOUNTER — Telehealth: Payer: Self-pay | Admitting: Physician Assistant

## 2020-10-09 ENCOUNTER — Other Ambulatory Visit: Payer: Self-pay | Admitting: Internal Medicine

## 2020-10-09 ENCOUNTER — Other Ambulatory Visit: Payer: Self-pay

## 2020-10-09 NOTE — Telephone Encounter (Signed)
Pt is requesting if iron medication could be changed to fusion iron pills. Pt states that the ones that she is currently taking make her constipated.

## 2020-10-09 NOTE — Telephone Encounter (Signed)
Fusion Iron is an OTC. Is there another form of oral iron supplement that is better tolerated?

## 2020-10-12 ENCOUNTER — Other Ambulatory Visit: Payer: Self-pay

## 2020-10-12 MED ORDER — FUSION PLUS PO CAPS: ORAL_CAPSULE | ORAL | 6 refills | Status: DC

## 2020-10-12 NOTE — Telephone Encounter (Signed)
She can switch to fusion if takes it twice daily and most all iron supplements cause constipation so can add Colace 1-2 at bedtime or use Miralax daily or QOD

## 2020-10-12 NOTE — Telephone Encounter (Signed)
Patient notified. Rx to her preferred pharmacy.

## 2020-10-14 ENCOUNTER — Telehealth: Payer: Self-pay

## 2020-10-14 NOTE — Telephone Encounter (Signed)
Patient has canceled her EGD/colon. She is going to [place] for 4 weeks. She will let us know when she is back.

## 2020-10-28 ENCOUNTER — Ambulatory Visit
Admission: RE | Admit: 2020-10-28 | Discharge: 2020-10-28 | Disposition: A | Discharge status: Home / Self Care | Payer: 59 | Source: Ambulatory Visit | Attending: Physician Assistant | Admitting: Physician Assistant

## 2020-10-28 ENCOUNTER — Other Ambulatory Visit: Payer: Self-pay

## 2020-10-28 DIAGNOSIS — N2889 Other specified disorders of kidney and ureter: Secondary | ICD-10-CM

## 2020-10-28 DIAGNOSIS — N289 Disorder of kidney and ureter, unspecified: Secondary | ICD-10-CM | POA: Diagnosis not present

## 2020-10-28 MED ORDER — GADOBENATE DIMEGLUMINE 529 MG/ML IV SOLN
20.0000 mL | Freq: Once | INTRAVENOUS | Status: AC | PRN
  Administered 2020-10-28: 20 mL via INTRAVENOUS

## 2020-11-05 ENCOUNTER — Other Ambulatory Visit: Payer: Self-pay | Admitting: Internal Medicine

## 2020-11-11 DIAGNOSIS — Z20822 Contact with and (suspected) exposure to covid-19: Secondary | ICD-10-CM | POA: Diagnosis not present

## 2020-11-12 ENCOUNTER — Other Ambulatory Visit: Payer: 59

## 2020-11-15 DIAGNOSIS — Z1159 Encounter for screening for other viral diseases: Secondary | ICD-10-CM | POA: Diagnosis not present

## 2020-11-15 DIAGNOSIS — Z20822 Contact with and (suspected) exposure to covid-19: Secondary | ICD-10-CM | POA: Diagnosis not present

## 2020-12-03 ENCOUNTER — Encounter: Payer: 59 | Admitting: Gastroenterology

## 2021-02-08 ENCOUNTER — Other Ambulatory Visit: Payer: Self-pay | Admitting: Internal Medicine

## 2021-02-17 ENCOUNTER — Encounter: Payer: Self-pay | Admitting: Family Medicine

## 2021-02-17 ENCOUNTER — Other Ambulatory Visit: Payer: Self-pay

## 2021-02-17 ENCOUNTER — Ambulatory Visit: Payer: 59 | Admitting: Family Medicine

## 2021-02-17 VITALS — BP 126/78 | HR 88 | Temp 96.9°F | Ht 67.0 in | Wt 238.1 lb

## 2021-02-17 DIAGNOSIS — D509 Iron deficiency anemia, unspecified: Secondary | ICD-10-CM | POA: Diagnosis not present

## 2021-02-17 DIAGNOSIS — N920 Excessive and frequent menstruation with regular cycle: Secondary | ICD-10-CM | POA: Insufficient documentation

## 2021-02-17 DIAGNOSIS — I10 Essential (primary) hypertension: Secondary | ICD-10-CM | POA: Diagnosis not present

## 2021-02-17 MED ORDER — OLMESARTAN MEDOXOMIL 20 MG PO TABS: 10.0000 mg | ORAL_TABLET | Freq: Every day | ORAL | 3 refills | Status: AC

## 2021-02-17 MED ORDER — AMLODIPINE BESYLATE 10 MG PO TABS: 1.0000 | ORAL_TABLET | Freq: Every day | ORAL | 3 refills | Status: AC

## 2021-02-17 NOTE — Patient Instructions (Addendum)
Take care of yourself   Continue your current medications   Lab today for HTN and also iron deficiency  We will advise from there

## 2021-02-17 NOTE — Assessment & Plan Note (Signed)
Controlled with IUD in the past Last one was ejected  Now heavy again and anemic Recommend f/u with her gyn

## 2021-02-17 NOTE — Progress Notes (Signed)
Subjective:    Patient ID: Amanda [name], female    DOB: [birth year], 45 y.o.   MRN: [id number]  This visit occurred during the SARS-CoV-2 public health emergency.  Safety protocols were in place, including screening questions prior to the visit, additional usage of staff PPE, and extensive cleaning of exam room while observing appropriate contact time as indicated for disinfecting solutions.   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    HTN BP Readings from Last 3 Encounters:  02/17/21 126/78  09/17/20 (!) 144/92  09/13/20 (!) 150/87   Pulse Readings from Last 3 Encounters:  02/17/21 88  09/17/20 74  09/13/20 80     Taking amlodipine 10 mg daily   Was on olmasartan 20 mg in the past- bp was too low so she dec that to 10 mg daily   Lab Results  Component Value Date   CREATININE 0.70 08/05/2020   BUN 10 08/05/2020   NA 141 08/05/2020   K 3.5 08/05/2020   CL 108 08/05/2020   CO2 28 08/05/2020   Lab Results  Component Value Date   CHOL 156 01/15/2020   HDL 55 01/15/2020   LDLCALC 89 01/15/2020   TRIG 57 01/15/2020   CHOLHDL 2.8 01/15/2020      Is on iron for chronic iron def anemia (has had for years and seen GI) Very heavy menses  Has menses currently  Lab Results  Component Value Date   WBC 9.0 01/15/2020   HGB 10.5 (L) 01/15/2020   HCT 34.5 01/15/2020   MCV 74 (L) 01/15/2020   PLT 239 01/15/2020   Lab Results  Component Value Date   IRON 34 (L) 08/05/2020   FERRITIN 5.1 (L) 08/05/2020  Takes a fusion plus iron and probiotic cap (does better with this)   Was on ferrous sulfate-caused constipation    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 and will need f/u MRI in 6-12 mo   Patient Active Problem List   Diagnosis Date Noted   Iron  deficiency anemia 02/17/2021   Essential hypertension 03/13/2017   Depression 11/15/2011   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   Past Surgical History:  Procedure Laterality Date   none     Social History   Tobacco Use   Smoking status: Never   Smokeless tobacco: Never  Vaping Use   Vaping Use: Never used  Substance Use Topics   Alcohol use: Yes    Comment: rare   Drug use: No   Family History  Problem Relation Age of Onset   Hypertension Mother    Cancer Neg Hx    Colon cancer Neg Hx    Stomach cancer Neg Hx    Esophageal cancer Neg Hx    Pancreatic cancer Neg Hx    Allergies  Allergen Reactions   Tramadol Other (See Comments)   Multihance [Gadobenate] Nausea And Vomiting    Patient vomited and continued to have nausea after exam   Sulfa Antibiotics    Current Outpatient Medications on  File Prior to Visit  Medication Sig Dispense Refill   Iron-FA-B Cmp-C-Biot-Probiotic (FUSION PLUS) CAPS Take 1 capsule twice daily to replace OTC iron supplement 30 capsule 6   No current facility-administered medications on file prior to visit.    Review of Systems  Constitutional:  Positive for fatigue. Negative for activity change, appetite change, fever and unexpected weight change.  HENT:  Negative for congestion, ear pain, rhinorrhea, sinus pressure and sore throat.   Eyes:  Negative for pain, redness and visual disturbance.  Respiratory:  Negative for cough, shortness of breath and wheezing.   Cardiovascular:  Negative for chest pain and palpitations.  Gastrointestinal:  Negative for abdominal pain, blood in stool, constipation and diarrhea.  Endocrine: Negative for polydipsia and polyuria.  Genitourinary:  Negative for dysuria, frequency and urgency.  Musculoskeletal:  Negative for arthralgias, back pain and  myalgias.  Skin:  Negative for pallor and rash.  Allergic/Immunologic: Negative for environmental allergies.  Neurological:  Negative for dizziness, syncope and headaches.  Hematological:  Negative for adenopathy. Does not bruise/bleed easily.  Psychiatric/Behavioral:  Negative for decreased concentration and dysphoric mood. The patient is not nervous/anxious.       Objective:   Physical Exam Constitutional:      General: She is not in acute distress.    Appearance: Normal appearance. She is well-developed. She is obese. She is not ill-appearing or diaphoretic.  HENT:     Head: Normocephalic and atraumatic.  Eyes:     Conjunctiva/sclera: Conjunctivae normal.     Pupils: Pupils are equal, round, and reactive to light.  Neck:     Thyroid: No thyromegaly.     Vascular: No carotid bruit or JVD.  Cardiovascular:     Rate and Rhythm: Normal rate and regular rhythm.     Heart sounds: Normal heart sounds.    No gallop.  Pulmonary:     Effort: Pulmonary effort is normal. No respiratory distress.     Breath sounds: Normal breath sounds. No wheezing or rales.  Abdominal:     General: Bowel sounds are normal. There is no distension or abdominal bruit.     Palpations: Abdomen is soft. There is no mass.     Tenderness: There is no abdominal tenderness.     Comments: No suprapubic tenderness or fullness    Musculoskeletal:     Cervical back: Normal range of motion and neck supple.     Right lower leg: No edema.     Left lower leg: No edema.  Lymphadenopathy:     Cervical: No cervical adenopathy.  Skin:    General: Skin is warm and dry.     Coloration: Skin is not pale.     Findings: No bruising, erythema or rash.  Neurological:     Mental Status: She is alert.     Coordination: Coordination normal.     Deep Tendon Reflexes: Reflexes are normal and symmetric. Reflexes normal.  Psychiatric:        Mood and Affect: Mood normal.        Cognition and Memory: Cognition and memory normal.           Assessment & Plan:   Problem List Items Addressed This Visit       Cardiovascular and Mediastinum   Essential hypertension - Primary    bp in fair control at this time  BP Readings from Last 1 Encounters:  02/17/21 126/78  No changes needed Most recent labs reviewed  Disc lifstyle change with low  sodium diet and exercise  Controlled with amlodipine 10 mg daily and olmasartan 10 mg daily  Labs ordered        Relevant Medications   olmesartan (BENICAR) 20 MG tablet   amLODipine (NORVASC) 10 MG tablet   Other Relevant Orders   CBC with Differential/Platelet   Comprehensive metabolic panel   Lipid panel   TSH     Other   Iron deficiency anemia    Suspected due to heavy menses  Pt had iud in the past but it came out-adv to f/u with her gyn   Cbc with iron and ferritin today  Currently takes iron fusion plus caps and will need refill dep on result  She has less constipation with this than ferous sulfate       Relevant Orders   CBC with Differential/Platelet   Iron   Ferritin   Heavy menses    Controlled with IUD in the past Last one was ejected  Now heavy again and anemic Recommend f/u with her gyn

## 2021-02-17 NOTE — Assessment & Plan Note (Signed)
Suspected due to heavy menses  Pt had iud in the past but it came out-adv to f/u with her gyn   Cbc with iron and ferritin today  Currently takes iron fusion plus caps and will need refill dep on result  She has less constipation with this than ferous sulfate

## 2021-02-17 NOTE — Assessment & Plan Note (Signed)
bp in fair control at this time  BP Readings from Last 1 Encounters:  02/17/21 126/78   No changes needed Most recent labs reviewed  Disc lifstyle change with low sodium diet and exercise  Controlled with amlodipine 10 mg daily and olmasartan 10 mg daily  Labs ordered

## 2021-02-18 ENCOUNTER — Other Ambulatory Visit: Payer: Self-pay | Admitting: Family Medicine

## 2021-02-18 DIAGNOSIS — D509 Iron deficiency anemia, unspecified: Secondary | ICD-10-CM

## 2021-02-18 LAB — LIPID PANEL
Chol/HDL Ratio: 2.9 ratio (ref 0.0–4.4)
Cholesterol, Total: 142 mg/dL (ref 100–199)
HDL: 49 mg/dL (ref >39)
LDL Chol Calc (NIH): 80 mg/dL (ref 0–99)
Triglycerides: 62 mg/dL (ref 0–149)
VLDL Cholesterol Cal: 13 mg/dL (ref 5–40)

## 2021-02-18 LAB — CBC WITH DIFFERENTIAL/PLATELET
Basophils Absolute: 0 10*3/uL (ref 0.0–0.2)
Basos: 1 %
EOS (ABSOLUTE): 0.2 10*3/uL (ref 0.0–0.4)
Eos: 2 %
Hematocrit: 33.3 % — ABNORMAL LOW (ref 34.0–46.6)
Hemoglobin: 9.9 g/dL — ABNORMAL LOW (ref 11.1–15.9)
Immature Grans (Abs): 0 10*3/uL (ref 0.0–0.1)
Immature Granulocytes: 0 %
Lymphocytes Absolute: 2.3 10*3/uL (ref 0.7–3.1)
Lymphs: 37 %
MCH: 21.9 pg — ABNORMAL LOW (ref 26.6–33.0)
MCHC: 29.7 g/dL — ABNORMAL LOW (ref 31.5–35.7)
MCV: 74 fL — ABNORMAL LOW (ref 79–97)
Monocytes Absolute: 0.5 10*3/uL (ref 0.1–0.9)
Monocytes: 8 %
Neutrophils Absolute: 3.2 10*3/uL (ref 1.4–7.0)
Neutrophils: 52 %
Platelets: 289 10*3/uL (ref 150–450)
RBC: 4.53 x10E6/uL (ref 3.77–5.28)
RDW: 16 % — ABNORMAL HIGH (ref 11.7–15.4)
WBC: 6.3 10*3/uL (ref 3.4–10.8)

## 2021-02-18 LAB — COMPREHENSIVE METABOLIC PANEL
ALT: 11 IU/L (ref 0–32)
AST: 17 IU/L (ref 0–40)
Albumin/Globulin Ratio: 1.3 (ref 1.2–2.2)
Albumin: 3.8 g/dL (ref 3.8–4.8)
Alkaline Phosphatase: 73 IU/L (ref 44–121)
BUN/Creatinine Ratio: 9 (ref 9–23)
BUN: 6 mg/dL (ref 6–24)
Bilirubin Total: <0.2 mg/dL (ref 0.0–1.2)
CO2: 21 mmol/L (ref 20–29)
Calcium: 9.2 mg/dL (ref 8.7–10.2)
Chloride: 107 mmol/L — ABNORMAL HIGH (ref 96–106)
Creatinine, Ser: 0.67 mg/dL (ref 0.57–1.00)
Globulin, Total: 2.9 g/dL (ref 1.5–4.5)
Glucose: 77 mg/dL (ref 65–99)
Potassium: 4.3 mmol/L (ref 3.5–5.2)
Sodium: 142 mmol/L (ref 134–144)
Total Protein: 6.7 g/dL (ref 6.0–8.5)
eGFR: 110 mL/min/{1.73_m2} (ref >59)

## 2021-02-18 LAB — IRON: Iron: 31 ug/dL (ref 27–159)

## 2021-02-18 LAB — TSH: TSH: 1.91 u[IU]/mL (ref 0.450–4.500)

## 2021-02-18 LAB — FERRITIN: Ferritin: 17 ng/mL (ref 15–150)

## 2021-02-18 NOTE — Telephone Encounter (Signed)
Labs are stable but anemia is a it worse than previous (having menses now as well)  Does she take the fusion iron once or twice daily?

## 2021-02-23 MED ORDER — FUSION PLUS PO CAPS: ORAL_CAPSULE | ORAL | 5 refills | Status: AC

## 2021-02-23 NOTE — Telephone Encounter (Signed)
Pt returning call.  I relayed Dr. Marliss Coots message.  Pt states she takes iron caps once daily.

## 2021-02-23 NOTE — Telephone Encounter (Signed)
Lvm asking pt to call back.  Need to get answers to Dr. Marliss Coots questions.

## 2021-02-23 NOTE — Telephone Encounter (Signed)
Please increase to twice daily  I sent it in  Re check cbc with iron and ferritin in about a mo

## 2021-03-11 ENCOUNTER — Other Ambulatory Visit: Payer: Self-pay

## 2021-03-11 ENCOUNTER — Ambulatory Visit (INDEPENDENT_AMBULATORY_CARE_PROVIDER_SITE_OTHER): Payer: 59

## 2021-03-11 ENCOUNTER — Ambulatory Visit (INDEPENDENT_AMBULATORY_CARE_PROVIDER_SITE_OTHER): Payer: 59 | Admitting: Podiatry

## 2021-03-11 ENCOUNTER — Other Ambulatory Visit: Payer: Self-pay | Admitting: Podiatry

## 2021-03-11 DIAGNOSIS — M7989 Other specified soft tissue disorders: Secondary | ICD-10-CM

## 2021-03-11 DIAGNOSIS — S9032XA Contusion of left foot, initial encounter: Secondary | ICD-10-CM

## 2021-03-11 DIAGNOSIS — M67472 Ganglion, left ankle and foot: Secondary | ICD-10-CM

## 2021-03-11 NOTE — Patient Instructions (Signed)
https://www.foothealthfacts.org/conditions/ganglion-cyst"> https://www.clinicalkey.com">  Ganglion Cyst  A ganglion cyst is a non-cancerous, fluid-filled lump of tissue that occurs near a joint, tendon, or ligament. The cyst grows out of a joint or the lining of a tendon or ligament. Ganglion cysts most often develop in the hand or wrist, but they can also develop in the shoulder, elbow, hip, knee, ankle, orfoot. Ganglion cysts are ball-shaped or egg-shaped. Their size can range from the size of a pea to larger than a grape. Increased activity may cause the cyst toget bigger because more fluid starts to build up. What are the causes? The exact cause of this condition is not known, but it may be related to: Inflammation or irritation around the joint. An injury or tear in the layers of tissue around the joint (joint capsule). Repetitive movements or overuse. History of acute or repeated injury. What increases the risk? You are more likely to develop this condition if: You are a female. You are 58-26 years old. What are the signs or symptoms? The main symptom of this condition is a lump. It most often appears on the hand or wrist. In many cases, there are no other symptoms, but a cyst can sometimes cause: Tingling. Pain or tenderness. Numbness. Weakness or loss of strength in the affected joint. Decreased range of motion in the affected area of the body. How is this diagnosed? Ganglion cysts are usually diagnosed based on a physical exam. Your health care provider will feel the lump and may shine a light next to it. If it is aganglion cyst, the light will likely shine through it. Your health care provider may order an X-ray, ultrasound, MRI, or CT scan torule out other conditions. How is this treated? Ganglion cysts often go away on their own without treatment. If you have pain or other symptoms, treatment may be needed. Treatment is also needed if the ganglion cyst limits your movement or if  it gets infected. Treatment may include: Wearing a brace or splint on your wrist or finger. Taking anti-inflammatory medicine. Having fluid drained from the lump with a needle (aspiration). Getting an injection of medicine into the joint to decrease inflammation. This may be corticosteroids, ethanol, or hyaluronidase. Having surgery to remove the ganglion cyst. Placing a pad in your shoe or wearing shoes that will not rub against the cyst if it is on your foot. Follow these instructions at home: Do not press on the ganglion cyst, poke it with a needle, or hit it. Take over-the-counter and prescription medicines only as told by your health care provider. If you have a brace or splint: Wear it as told by your health care provider. Remove it as told by your health care provider. Ask if you need to remove it when you take a shower or a bath. Watch your ganglion cyst for any changes. Keep all follow-up visits as told by your health care provider. This is important. Contact a health care provider if: Your ganglion cyst becomes larger or more painful. You have pus coming from the lump. You have weakness or numbness in the affected area. You have a fever or chills. Get help right away if: You have a fever and have any of these in the cyst area: Increased redness. Red streaks. Swelling. Summary A ganglion cyst is a non-cancerous, fluid-filled lump that occurs near a joint, tendon, or ligament. Ganglion cysts most often develop in the hand or wrist, but they can also develop in the shoulder, elbow, hip, knee, ankle, or foot.  Ganglion cysts often go away on their own without treatment. This information is not intended to replace advice given to you by your health care provider. Make sure you discuss any questions you have with your healthcare provider. Document Revised: 11/13/2019 Document Reviewed: 11/13/2019 Elsevier Patient Education  [place].

## 2021-03-12 ENCOUNTER — Telehealth: Payer: Self-pay | Admitting: Podiatry

## 2021-03-12 LAB — NON-GYN, SPECIMEN A

## 2021-03-12 LAB — CYTOLOGY - NON PAP

## 2021-03-12 NOTE — Telephone Encounter (Signed)
[facility] called to verify if we sent the specimen or lab. Its still on the pending list. Please call mary [phone]

## 2021-03-15 NOTE — Progress Notes (Signed)
Subjective:   Patient ID: Amanda [name], female   DOB: 45 y.o.   MRN: [id number]   HPI 45 year old female presents the office today for concerns of a reoccurring cyst on the top of the left foot.  She states that she was seen thousand 18 for the same issue as the area was drained and has done well but has started to come back and is causing discomfort.  Is been come back over the last 4 months.  No recent injury or trauma she reports.  No other treatment.  No other concerns.   Review of Systems  All other systems reviewed and are negative.  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    Past Surgical History:  Procedure Laterality Date   none       Current Outpatient Medications:    amLODipine (NORVASC) 10 MG tablet, Take 1 tablet (10 mg total) by mouth daily., Disp: 90 tablet, Rfl: 3   Iron-FA-B Cmp-C-Biot-Probiotic (FUSION PLUS) CAPS, Take 1 capsule twice daily to replace OTC iron supplement, Disp: 60 capsule, Rfl: 5   olmesartan (BENICAR) 20 MG tablet, Take 0.5 tablets (10 mg total) by mouth daily., Disp: 45 tablet, Rfl: 3  Allergies  Allergen Reactions   Tramadol Other (See Comments)   Multihance [Gadobenate] Nausea And Vomiting    Patient vomited and continued to have nausea after exam   Sulfa Antibiotics           Objective:  Physical Exam  General: AAO x3, NAD  Dermatological: Skin is warm, dry and supple bilateral.  There are no open sores, no preulcerative lesions, no rash or signs of infection present.  Vascular: Dorsalis Pedis artery and Posterior Tibial artery pedal pulses are 2/4 bilateral with immedate capillary fill time. There is no pain with calf compression, swelling, warmth, erythema.   Neruologic: Grossly intact via light touch bilateral.   Musculoskeletal: On the dorsal aspect the left foot on  the midfoot area is a fluid-filled mobile soft tissue mass consistent with a ganglion cyst.  Mild discomfort of the area.  There is no erythema or warmth associated with it.  No other areas of discomfort identified.  Muscular strength 5/5 in all groups tested bilateral.  Gait: Unassisted, Nonantalgic.       Assessment:   Soft tissue mass, likely ganglion cyst dorsal left foot     Plan:  -Treatment options discussed including all alternatives, risks, and complications .-Etiology of symptoms were discussed -X-rays were obtained and reviewed with the patient.  No subacute fracture or stress fracture.  No calcifications present. -Discussed with her treatment options after discussion wants to proceed with aspiration.  The skin was cleaned with alcohol and lidocaine plain was infiltrated in a regional block fashion.  Skin was then prepped with Betadine and I utilized an 18-gauge needle to aspirate clear, gel like fluid mixed with some blood.  There is no purulence or signs of infection.  At the conclusion I did inject 1 cc Kenalog 10, Marcaine plain into the area.  Compression bandage applied.  Postinjection care discussed.  She tolerated the procedure well. -Fluids sent to pathology

## 2021-03-19 ENCOUNTER — Telehealth: Payer: Self-pay | Admitting: *Deleted

## 2021-03-19 NOTE — Telephone Encounter (Signed)
-----   Message from Trula Slade, DPM sent at 03/18/2021  7:39 PM EDT ----- Please let her know that the pathology showed a benign cyst.

## 2021-03-19 NOTE — Telephone Encounter (Signed)
Called and left a message for the patient and relayed the message per Dr Wagoner. Lacey Dotson 

## 2021-04-20 ENCOUNTER — Telehealth: Payer: Self-pay | Admitting: Physician Assistant

## 2021-04-20 DIAGNOSIS — D259 Leiomyoma of uterus, unspecified: Secondary | ICD-10-CM | POA: Diagnosis not present

## 2021-04-20 DIAGNOSIS — R5381 Other malaise: Secondary | ICD-10-CM | POA: Diagnosis not present

## 2021-04-20 DIAGNOSIS — D509 Iron deficiency anemia, unspecified: Secondary | ICD-10-CM | POA: Diagnosis not present

## 2021-04-20 DIAGNOSIS — R1013 Epigastric pain: Secondary | ICD-10-CM | POA: Diagnosis not present

## 2021-04-20 DIAGNOSIS — L299 Pruritus, unspecified: Secondary | ICD-10-CM | POA: Diagnosis not present

## 2021-04-20 DIAGNOSIS — N2889 Other specified disorders of kidney and ureter: Secondary | ICD-10-CM | POA: Diagnosis not present

## 2021-04-20 NOTE — Telephone Encounter (Signed)
Please advise.  Her last lab was in June. Hgb is still low. She was last seen in our office December 2021.

## 2021-04-20 NOTE — Telephone Encounter (Signed)
Inbound call from patient stating she is now ready to schedule procedures.  Can you please advise if ok to schedule direct or would she need an office visit.

## 2021-04-21 ENCOUNTER — Telehealth: Payer: Self-pay

## 2021-04-21 DIAGNOSIS — Z139 Encounter for screening, unspecified: Secondary | ICD-10-CM | POA: Diagnosis not present

## 2021-04-21 DIAGNOSIS — Z6837 Body mass index (BMI) 37.0-37.9, adult: Secondary | ICD-10-CM | POA: Diagnosis not present

## 2021-04-21 DIAGNOSIS — N920 Excessive and frequent menstruation with regular cycle: Secondary | ICD-10-CM | POA: Diagnosis not present

## 2021-04-21 DIAGNOSIS — Z124 Encounter for screening for malignant neoplasm of cervix: Secondary | ICD-10-CM | POA: Diagnosis not present

## 2021-04-21 DIAGNOSIS — Z1231 Encounter for screening mammogram for malignant neoplasm of breast: Secondary | ICD-10-CM | POA: Diagnosis not present

## 2021-04-21 DIAGNOSIS — Z01419 Encounter for gynecological examination (general) (routine) without abnormal findings: Secondary | ICD-10-CM | POA: Diagnosis not present

## 2021-04-21 DIAGNOSIS — D649 Anemia, unspecified: Secondary | ICD-10-CM | POA: Diagnosis not present

## 2021-04-21 DIAGNOSIS — Z1211 Encounter for screening for malignant neoplasm of colon: Secondary | ICD-10-CM | POA: Diagnosis not present

## 2021-04-21 DIAGNOSIS — Z113 Encounter for screening for infections with a predominantly sexual mode of transmission: Secondary | ICD-10-CM | POA: Diagnosis not present

## 2021-04-21 NOTE — Telephone Encounter (Signed)
Left message for the patient to call back. Is patient wanting to stay with [place] and if so need to discuss [facility] appointment with new NP.

## 2021-04-23 ENCOUNTER — Encounter: Payer: Self-pay | Admitting: Gastroenterology

## 2021-04-23 NOTE — Telephone Encounter (Signed)
She can be scheduled a s a direct per Nicoletta Ba, PA. Thanks

## 2021-04-23 NOTE — Telephone Encounter (Signed)
Patient returned call scheduled PV 9/29. Direct EGD/COLON 10/13

## 2021-04-23 NOTE — Telephone Encounter (Signed)
Left voicemail for patient to return call.

## 2021-05-26 ENCOUNTER — Other Ambulatory Visit: Payer: Self-pay | Admitting: Obstetrics and Gynecology

## 2021-05-26 DIAGNOSIS — N920 Excessive and frequent menstruation with regular cycle: Secondary | ICD-10-CM | POA: Diagnosis not present

## 2021-05-26 DIAGNOSIS — D259 Leiomyoma of uterus, unspecified: Secondary | ICD-10-CM | POA: Diagnosis not present

## 2021-06-03 ENCOUNTER — Other Ambulatory Visit (HOSPITAL_COMMUNITY): Payer: Self-pay

## 2021-06-03 ENCOUNTER — Ambulatory Visit (AMBULATORY_SURGERY_CENTER): Payer: 59

## 2021-06-03 VITALS — Ht 67.0 in | Wt 236.0 lb

## 2021-06-03 DIAGNOSIS — D509 Iron deficiency anemia, unspecified: Secondary | ICD-10-CM

## 2021-06-03 MED ORDER — NA SULFATE-K SULFATE-MG SULF 17.5-3.13-1.6 GM/177ML PO SOLN
1.0000 | Freq: Once | ORAL | 0 refills | Status: AC
  Filled 2021-06-03: qty 354, 1d supply, fill #0

## 2021-06-03 NOTE — Progress Notes (Signed)
No egg or soy allergy known to patient    NO PAST SURGERIES  No FH of Malignant Hyperthermia Pt is not on diet pills Pt is not on  home 02  Pt is not on blood thinners  Pt denies issues with constipation  No A fib or A flutter  EMMI video to pt or via MyChart   Virtual previsit  Pt is fully vaccinated  for Covid   NO PA's for preps discussed with pt In PV today  Discussed with pt there will be an out-of-pocket cost for prep and that varies from $0 to 70 +  dollars   Due to the COVID-19 pandemic we are asking patients to follow certain guidelines.  Pt aware of COVID protocols and [facility] guidelines

## 2021-06-09 ENCOUNTER — Encounter: Payer: Self-pay | Admitting: Gastroenterology

## 2021-06-11 ENCOUNTER — Other Ambulatory Visit (HOSPITAL_COMMUNITY): Payer: Self-pay

## 2021-06-17 ENCOUNTER — Telehealth: Payer: Self-pay | Admitting: Gastroenterology

## 2021-06-17 ENCOUNTER — Encounter: Payer: 59 | Admitting: Gastroenterology

## 2021-06-17 NOTE — Telephone Encounter (Signed)
Good Morning Dr. Havery Moros,   I called patient and left a message about her procedure at 9:45 am.  She called back stating she had forgotten and was not feeling well, she stated she had a sore throat and a headache.  She said she would call back to reschedule.

## 2021-06-17 NOTE — Telephone Encounter (Signed)
Sorry to hear this. Unfortunately if she simply forgot about her procedure she should be charged a cancellation fee. Further, she needs a clinic visit prior to proceeding with scheduling any further procedures given this occurrence. Her last clinic visit was December 2021 and she has a cancelled a few appointments since that time. Thanks

## 2021-06-23 DIAGNOSIS — N920 Excessive and frequent menstruation with regular cycle: Secondary | ICD-10-CM | POA: Diagnosis not present

## 2021-06-23 DIAGNOSIS — N840 Polyp of corpus uteri: Secondary | ICD-10-CM | POA: Diagnosis not present

## 2021-12-10 IMAGING — MR MR ABDOMEN WO/W CM
16 series · 48 of 48 positions shown · IV contrast (20 MULTIHANCE)
Comparison: CT abdomen pelvis August 12, 2020

CLINICAL DATA: Further characterization of renal lesions seen on
prior CT abdomen and pelvis

EXAM:
MRI ABDOMEN WITHOUT AND WITH CONTRAST
TECHNIQUE: Multiplanar multisequence MR imaging of the abdomen was performed
both before and after the administration of intravenous contrast.
CONTRAST:  20mL MULTIHANCE GADOBENATE DIMEGLUMINE 529 MG/ML IV SOLN

[Series 3: T2 · coronal · 5.0mm · 1.56mm/px · 2 of 34 slices shown (1 of 3)]
[im 1/34]
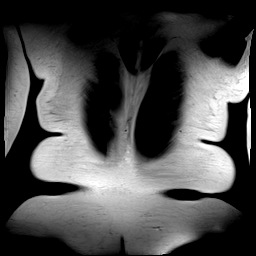
[im 34/34]
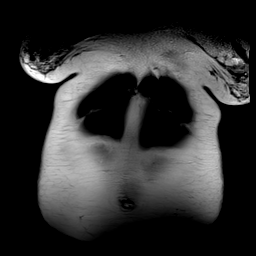

[Series 4: T1 · axial · 3.0mm · 1.19mm/px · z∈[-29,+184]mm · 7 of 144 slices shown]
[im 1/144]
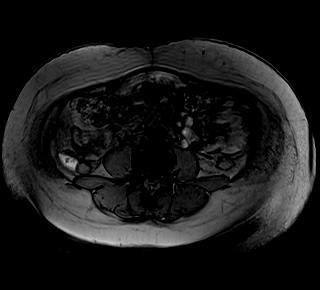
[im 24/144]
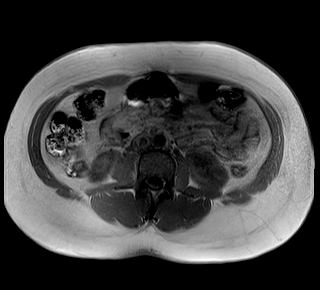
[im 48/144]
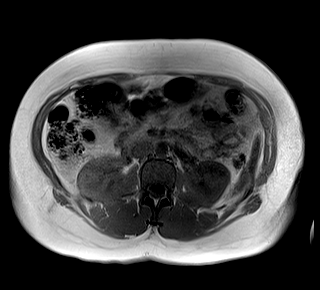
[im 72/144]
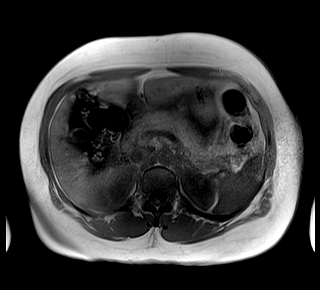
[im 96/144]
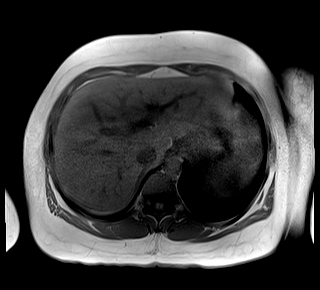
[im 120/144]
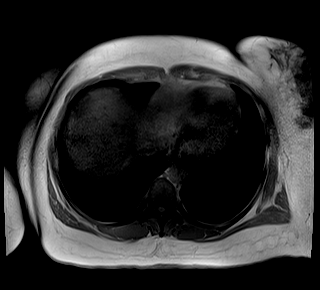
[im 144/144]
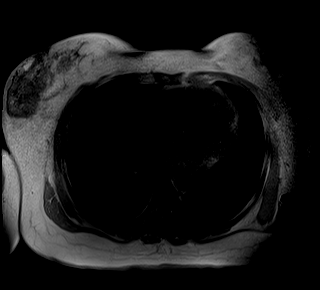

[Series 5: T2 · axial · 5.0mm · 1.48mm/px · z∈[-24,+210]mm · 2 of 40 slices shown (2 of 3)]
[im 1/40]
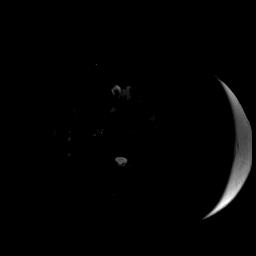
[im 40/40]
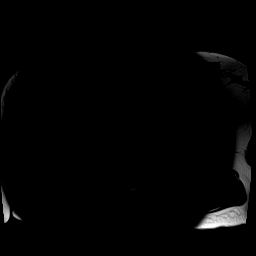

[Series 6: DWI · axial · 5.0mm · 1.42mm/px · z∈[-24,+210]mm · 5 of 120 slices shown (1 of 2)]
[im 1/120]
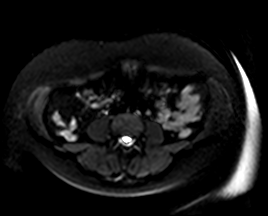
[im 30/120]
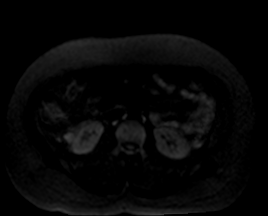
[im 60/120]
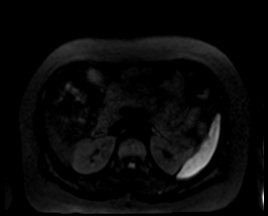
[im 90/120]
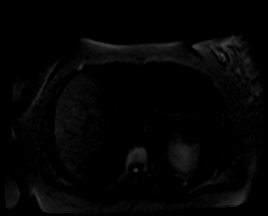
[im 120/120]
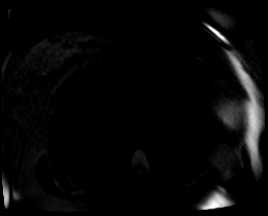

[Series 7: DWI · axial · 5.0mm · 1.42mm/px · z∈[-24,+210]mm · 2 of 40 slices shown (2 of 2)]
[im 1/40]
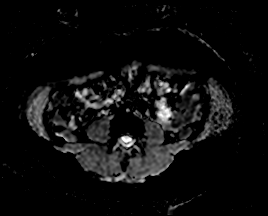
[im 40/40]
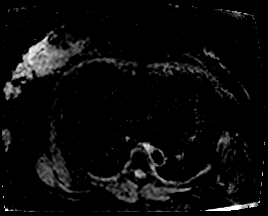

[Series 8: T2 · axial · 6.0mm · 1.22mm/px · 1 of 30 slices shown (3 of 3)]
[im 1/30]
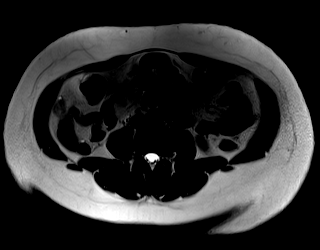

[Series 9: bSSFP · axial · 5.0mm · 1.25mm/px · z∈[-34,+188]mm · 2 of 38 slices shown]
[im 1/38]
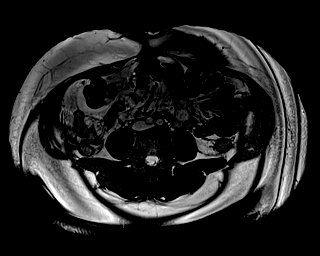
[im 38/38]
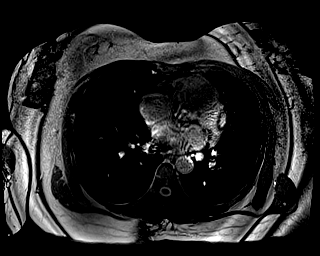

[Series 10: T1 dynamic · axial · non-contrast · 3.0mm · 1.25mm/px · z∈[-29,+184]mm · 3 of 72 slices shown (1 of 2)]
[im 1/72]
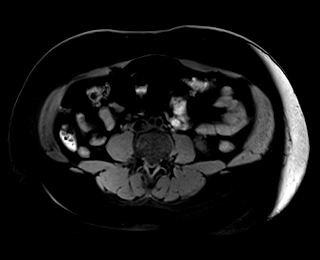
[im 36/72]
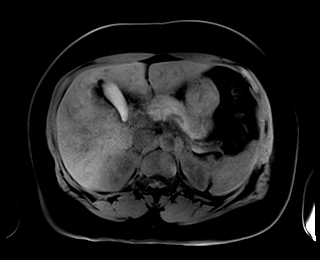
[im 72/72]
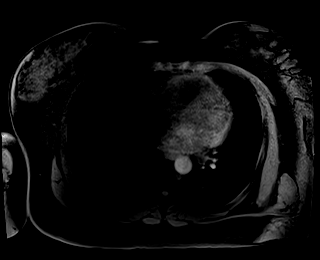

[Series 11: T1 dynamic · axial · non-contrast · 3.0mm · 1.25mm/px · z∈[-35,+178]mm · 3 of 72 slices shown (2 of 2)]
[im 1/72]
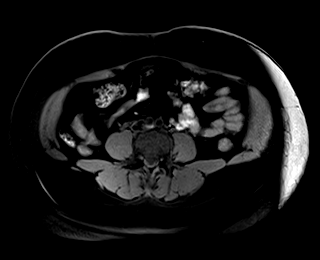
[im 36/72]
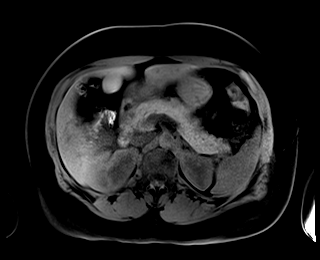
[im 72/72]
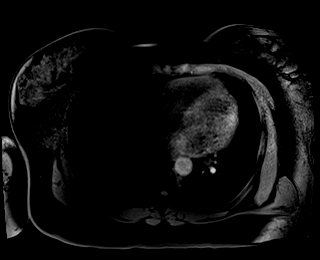

[Series 12: T1 dynamic post-contrast · axial · 3.0mm · 1.25mm/px · z∈[-35,+178]mm · 3 of 72 slices shown (1 of 5)]
[im 1/72]
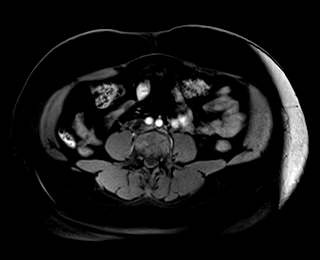
[im 36/72]
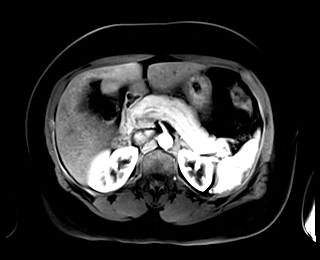
[im 72/72]
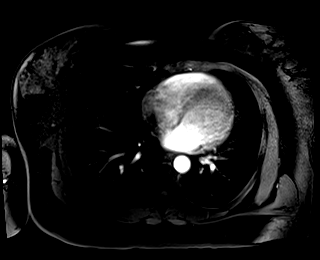

[Series 13: T1 dynamic post-contrast · axial · 3.0mm · 1.25mm/px · z∈[-35,+178]mm · 3 of 72 slices shown (2 of 5)]
[im 1/72]
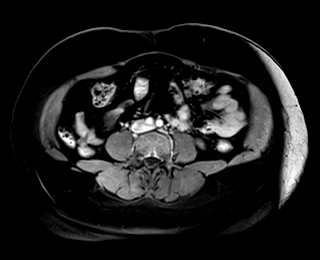
[im 36/72]
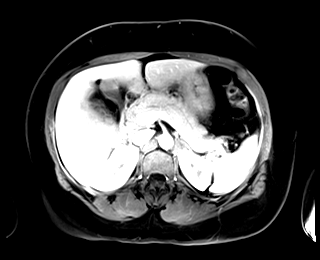
[im 72/72]
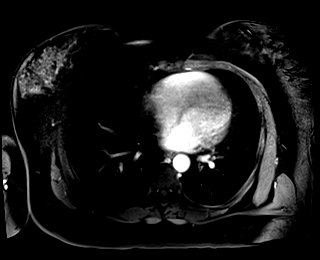

[Series 15: T1 dynamic post-contrast · axial · 3.0mm · 1.25mm/px · z∈[-67,+170]mm · 3 of 80 slices shown (3 of 5)]
[im 1/80]
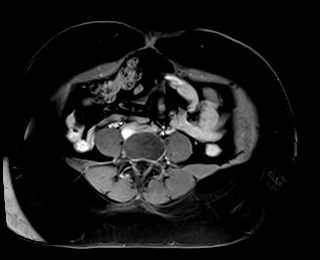
[im 40/80]
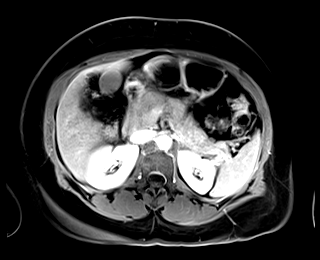
[im 80/80]
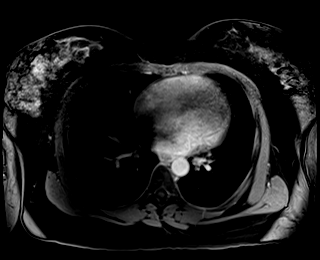

[Series 16: T1 dynamic post-contrast · coronal · 3.0mm · 1.25mm/px · 3 of 72 slices shown (4 of 5)]
[im 1/72]
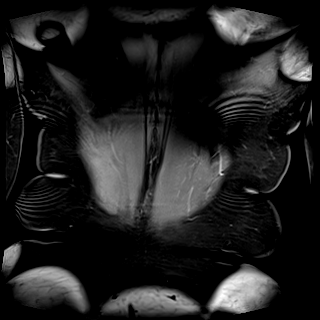
[im 36/72]
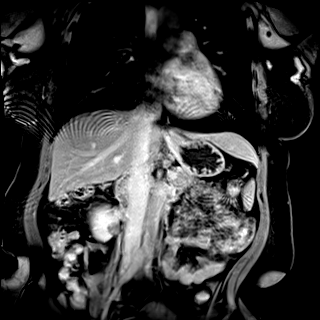
[im 72/72]
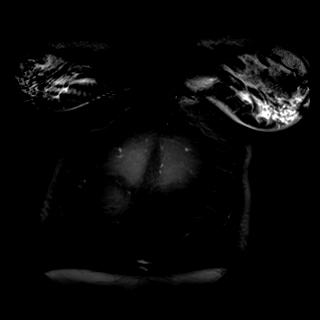

[Series 17: T1 dynamic post-contrast · axial · 3.0mm · 1.25mm/px · z∈[-67,+170]mm · 3 of 80 slices shown (5 of 5)]
[im 1/80]
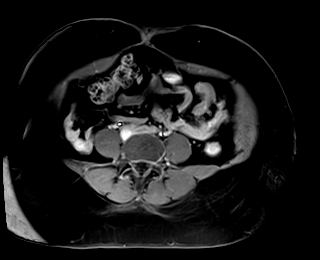
[im 40/80]
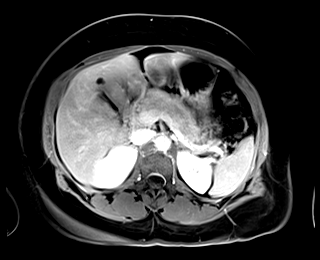
[im 80/80]
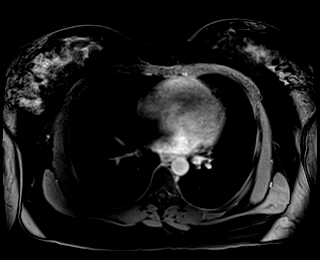

[Series 100: 25 sec sub · axial · 3.0mm · 1.25mm/px · z∈[-35,+178]mm · 3 of 72 slices shown]
[im 1/72]
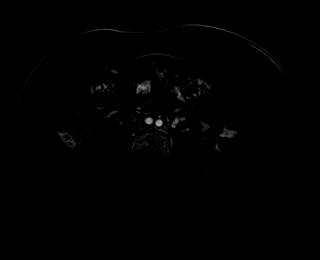
[im 36/72]
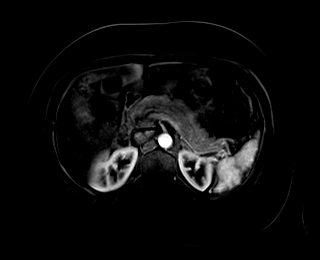
[im 72/72]
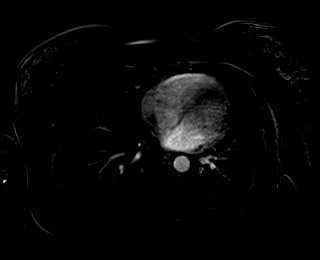

[Series 101: 45 sec sub · axial · 3.0mm · 1.25mm/px · z∈[-35,+178]mm · 3 of 72 slices shown]
[im 1/72]
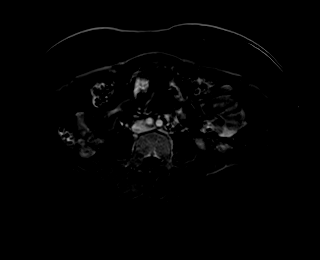
[im 36/72]
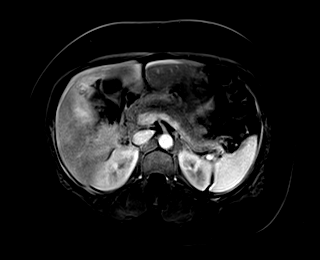
[im 72/72]
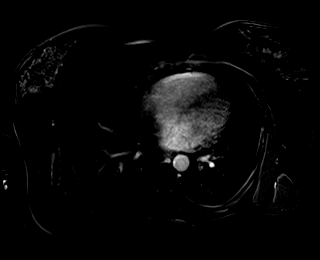

[48 of 48 positions shown; findings below may reference images not displayed]

FINDINGS: Lower chest: Unremarkable

Hepatobiliary: Bilobar hepatic cysts. No suspicious hepatic lesions.
Gallbladder is unremarkable. No biliary ductal dilatation.

Pancreas: No mass, inflammatory changes, or other parenchymal
abnormality identified.

Spleen:  Within normal limits in size and appearance.

Adrenals/Urinary Tract:  Adrenal glands are unremarkable.

In the lower pole of the RIGHT kidney there is an avidly enhancing
solid 1.6 cm lower pole renal lesion (series 9, image 31), which
demonstrates dropout of signal intensity on out of phase imaging
which corresponds with the lesion seen on prior CT.

LEFT kidney is unremarkable.  No hydronephrosis.

Stomach/Bowel: Visualized portions within the abdomen are
unremarkable.

Vascular/Lymphatic: No pathologically enlarged lymph nodes
identified. No abdominal aortic aneurysm demonstrated.

Other:  None.

Musculoskeletal: No suspicious bone lesions identified.
IMPRESSION: 1. Enhancing solid 1.6 cm RIGHT lower pole renal lesion demonstrates
areas of dropout of signal on out of phase imaging. Imaging
characteristics which are most consistent with a lipid poor
angiomyolipoma. For which follow-up MRI with and without contrast in
6-12 months to ensure stability is suggested.
2. No other significant findings in the abdomen.

## 2022-01-13 ENCOUNTER — Ambulatory Visit: Payer: 59 | Admitting: Nurse Practitioner

## 2022-03-22 ENCOUNTER — Ambulatory Visit: Payer: Managed Care, Other (non HMO) | Admitting: Podiatry

## 2022-07-20 ENCOUNTER — Encounter (INDEPENDENT_AMBULATORY_CARE_PROVIDER_SITE_OTHER): Payer: Self-pay

## 2022-11-08 ENCOUNTER — Encounter (INDEPENDENT_AMBULATORY_CARE_PROVIDER_SITE_OTHER): Payer: Self-pay | Admitting: Family Medicine

## 2022-11-08 ENCOUNTER — Ambulatory Visit (INDEPENDENT_AMBULATORY_CARE_PROVIDER_SITE_OTHER): Payer: 59 | Admitting: Family Medicine

## 2022-11-08 VITALS — BP 140/84 | HR 83 | Temp 98.6°F | Ht 67.0 in | Wt 250.0 lb

## 2022-11-08 DIAGNOSIS — E65 Localized adiposity: Secondary | ICD-10-CM

## 2022-11-08 DIAGNOSIS — Z0289 Encounter for other administrative examinations: Secondary | ICD-10-CM

## 2022-11-08 DIAGNOSIS — I10 Essential (primary) hypertension: Secondary | ICD-10-CM

## 2022-11-08 DIAGNOSIS — Z6839 Body mass index (BMI) 39.0-39.9, adult: Secondary | ICD-10-CM | POA: Diagnosis not present

## 2022-11-08 NOTE — Progress Notes (Signed)
Office: [phone]  /  Fax: [phone]   Initial Visit  Amanda [name] was seen in clinic today to evaluate for obesity. She is interested in losing weight to improve overall health and reduce the risk of weight related complications. She presents today to review program treatment options, initial physical assessment, and evaluation.     She was referred by: PCP  When asked what else they would like to accomplish? She states: Improve existing medical conditions and Improve appearance  Weight history: 180 lb post pregnancy with 47 yo daughter Goal weight: 180 lb.  Weight has slowly increased  When asked how has your weight affected you? She states: Contributed to medical problems, Having fatigue, and Problems with eating patterns  Some associated conditions: Hypertension  Contributing factors: Stress, Reduced physical activity, and Eating patterns Less walking at work; working and in [employer] school (2nd semester)  Weight promoting medications identified: None  Current nutrition plan: None  Current level of physical activity: NEAT  Current or previous pharmacotherapy: None  Response to medication: Never tried medications   Past medical history includes:   Past Medical History:  Diagnosis Date   Anemia 10/09/2006   Depression    Hypertension    Insomnia 10/09/2006   Itchy eyes 01/06/2006   Menorrhagia 08/18/2009   Nausea 10/09/2006   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  Peak Weight:250 , Body Fat%:47.5, Visceral Fat Rating:13, Weight trend over the last 12 months: Increasing  General:  Alert, oriented and cooperative. Patient is in no acute distress.  Respiratory: Normal respiratory effort, no problems with respiration noted   Gait: able to ambulate  independently  Mental Status: Normal mood and affect. Normal behavior. Normal judgment and thought content.   DIAGNOSTIC DATA REVIEWED:  BMET    Component Value Date/Time   NA 142 02/17/2021 1034   K 4.3 02/17/2021 1034   CL 107 (H) 02/17/2021 1034   CO2 21 02/17/2021 1034   GLUCOSE 77 02/17/2021 1034   GLUCOSE 78 08/05/2020 1229   BUN 6 02/17/2021 1034   CREATININE 0.67 02/17/2021 1034   CALCIUM 9.2 02/17/2021 1034   GFRNONAA 107 01/15/2020 1528   GFRAA 123 01/15/2020 1528   Lab Results  Component Value Date   HGBA1C 5.4 01/15/2020   HGBA1C 5.6 01/14/2013   No results found for: "INSULIN" CBC    Component Value Date/Time   WBC 6.3 02/17/2021 1034   WBC 8.1 01/14/2013 1538   RBC 4.53 02/17/2021 1034   RBC 4.23 01/14/2013 1538   HGB 9.9 (L) 02/17/2021 1034   HCT 33.3 (L) 02/17/2021 1034   PLT 289 02/17/2021 1034   MCV 74 (L) 02/17/2021 1034   MCH 21.9 (L) 02/17/2021 1034   MCHC 29.7 (L) 02/17/2021 1034   MCHC 32.6 01/14/2013 1538   RDW 16.0 (H) 02/17/2021 1034   Iron/TIBC/Ferritin/ %Sat    Component Value Date/Time   IRON 31 02/17/2021 1034   FERRITIN 17 02/17/2021 1034   IRONPCTSAT 8.6 (L) 08/05/2020 1229   Lipid Panel     Component Value Date/Time   CHOL 142 02/17/2021 1034   TRIG 62 02/17/2021 1034   HDL 49 02/17/2021 1034  CHOLHDL 2.9 02/17/2021 1034   CHOLHDL 3 01/14/2013 1538   VLDL 18.0 01/14/2013 1538   LDLCALC 80 02/17/2021 1034   Hepatic Function Panel     Component Value Date/Time   PROT 6.7 02/17/2021 1034   ALBUMIN 3.8 02/17/2021 1034   AST 17 02/17/2021 1034   ALT 11 02/17/2021 1034   ALKPHOS 73 02/17/2021 1034   BILITOT <0.2 02/17/2021 1034      Component Value Date/Time   TSH 1.910 02/17/2021 1034     Assessment and Plan:   Essential hypertension Assessment & Plan: BP at the upper limit of normal today on olmesartan 20 mg daily + amlodopine 10 mg daily without adverse side effects.  Look for improvements in HTN with  active plan for weight reduction.   Morbid obesity ([place]) Assessment & Plan: Reviewed bioimpedence results and program information Pt is motivated to move forward with lifestyle changes  Plan for EKG, fasting labs and indirect Calorimetry next visit. Avoid meal skipping   BMI 39.0-39.9,adult  Central adiposity Assessment & Plan: Reviewed visceral fat rating of 13 on bioimpedence and what this means for risk of metabolic disease/ cardiac disease.  Begin active plan for weight reduction.         Obesity Treatment / Action Plan:  Patient will work on garnering support from family and friends to begin weight loss journey. Will work on eliminating or reducing the presence of highly palatable, calorie dense foods in the home. Will complete provided nutritional and psychosocial assessment questionnaire before the next appointment. Will be scheduled for indirect calorimetry to determine resting energy expenditure in a fasting state.  This will allow [place] to create a reduced calorie, high-protein meal plan to promote loss of fat mass while preserving muscle mass. Will think about ideas on how to incorporate physical activity into their daily routine. Will avoid skipping meals which may result in increased hunger signals and overeating at certain times. Was counseled on nutritional approaches to weight loss and benefits of complex carbs and high quality protein as part of nutritional weight management. Was counseled on pharmacotherapy and role as an adjunct in weight management.   Obesity Education Performed Today:  She was weighed on the bioimpedance scale and results were discussed and documented in the synopsis.  We discussed obesity as a disease and the importance of a more detailed evaluation of all the factors contributing to the disease.  We discussed the importance of long term lifestyle changes which include nutrition, exercise and behavioral modifications as well as the  importance of customizing this to her specific health and social needs.  We discussed the benefits of reaching a healthier weight to alleviate the symptoms of existing conditions and reduce the risks of the biomechanical, metabolic and psychological effects of obesity.  Jaala Nute appears to be in the action stage of change and states they are ready to start intensive lifestyle modifications and behavioral modifications.  30 minutes was spent today on this visit including the above counseling, pre-visit chart review, and post-visit documentation.  Reviewed by clinician on day of visit: allergies, medications, problem list, medical history, surgical history, family history, social history, and previous encounter notes pertinent to obesity diagnosis.    Loyal Gambler, D.O.

## 2022-11-08 NOTE — Assessment & Plan Note (Signed)
Reviewed visceral fat rating of 13 on bioimpedence and what this means for risk of metabolic disease/ cardiac disease.  Begin active plan for weight reduction.

## 2022-11-08 NOTE — Assessment & Plan Note (Signed)
BP at the upper limit of normal today on olmesartan 20 mg daily + amlodopine 10 mg daily without adverse side effects.  Look for improvements in HTN with active plan for weight reduction.

## 2022-11-08 NOTE — Assessment & Plan Note (Signed)
Reviewed bioimpedence results and program information Pt is motivated to move forward with lifestyle changes  Plan for EKG, fasting labs and indirect Calorimetry next visit. Avoid meal skipping

## 2022-12-14 ENCOUNTER — Ambulatory Visit (INDEPENDENT_AMBULATORY_CARE_PROVIDER_SITE_OTHER): Payer: Managed Care, Other (non HMO) | Admitting: Family Medicine

## 2022-12-28 ENCOUNTER — Ambulatory Visit (INDEPENDENT_AMBULATORY_CARE_PROVIDER_SITE_OTHER): Payer: Managed Care, Other (non HMO) | Admitting: Family Medicine

## 2023-09-07 ENCOUNTER — Ambulatory Visit
Admission: EM | Admit: 2023-09-07 | Discharge: 2023-09-07 | Disposition: A | Discharge status: Home / Self Care | Payer: 59 | Attending: Family Medicine | Admitting: Family Medicine

## 2023-09-07 ENCOUNTER — Other Ambulatory Visit: Payer: Self-pay

## 2023-09-07 DIAGNOSIS — M25512 Pain in left shoulder: Secondary | ICD-10-CM

## 2023-09-07 DIAGNOSIS — R079 Chest pain, unspecified: Secondary | ICD-10-CM

## 2023-09-07 DIAGNOSIS — M79602 Pain in left arm: Secondary | ICD-10-CM

## 2023-09-07 MED ORDER — TIZANIDINE HCL 4 MG PO CAPS: 4.0000 mg | ORAL_CAPSULE | Freq: Three times a day (TID) | ORAL | 0 refills | Status: AC | PRN

## 2023-09-07 NOTE — ED Triage Notes (Signed)
 Patient presents to the office for left arm pain radiating down to her left leg.Patient reports a mild headache. Denies any falls or trauma to her arm or leg.

## 2023-09-07 NOTE — ED Notes (Signed)
 Called patient from lobby and cell phone. No answer or voice mail.

## 2023-09-07 NOTE — Discharge Instructions (Addendum)
 Your exam and EKG are reassuring today.  I suspect your pain to be muscular in nature and have sent over a muscle relaxer.  You may continue the over-the-counter pain relievers and try heat, massage, stretches, muscle rubs.  Go to the emergency department for severely worsening symptoms and follow-up with your primary care provider.

## 2023-09-11 NOTE — ED Provider Notes (Signed)
 [facility]    CSN: [id number] Arrival date & time: 09/07/23  1546      History   Chief Complaint Chief Complaint  Patient presents with   Back Pain   Neck Pain   Foot Pain   Headache    HPI Amanda [name] is a 48 y.o. female.   Patient presenting today with left-sided arm, shoulder and chest pain radiating as well as a mild headache at times.  States at times the pain radiates all the way down to her left flank.  Pain is worse with movement, deep breaths and she denies any associated shortness of breath, diaphoresis, nausea, vomiting, jaw pain, dizziness, visual change, weakness numbness or tingling.  Denies any injury to the area, swelling, discoloration, rashes.  So far trying over-the-counter pain relievers with minimal relief.  No past history of similar issues.    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      Home Medications    Prior to Admission medications   Medication Sig Start Date End Date Taking? Authorizing Provider  amLODipine  (NORVASC ) 10 MG tablet Take 1 tablet (10 mg total) by mouth daily. 02/17/21  Yes Tower, Marne A, MD  D3-50 1.25 MG (50000 UT) capsule Take 50,000 Units by mouth once a week. 04/22/21  Yes [provider]  Iron-FA-B Cmp-C-Biot-Probiotic (FUSION PLUS) CAPS Take 1 capsule twice daily to replace OTC iron supplement 02/23/21  Yes Tower, Laine LABOR, MD  olmesartan  (BENICAR ) 20 MG tablet Take 0.5 tablets (10 mg total)  by mouth daily. 02/17/21  Yes Tower, Laine LABOR, MD  tiZANidine  (ZANAFLEX ) 4 MG capsule Take 1 capsule (4 mg total) by mouth 3 (three) times daily as needed for muscle spasms. Do not drink alcohol or drive while taking this medication.  May cause drowsiness. 09/07/23  Yes Stuart Vernell Norris, PA-C    Family History Family History  Problem Relation Age of Onset   Hypertension Mother    Cancer Neg Hx    Colon cancer Neg Hx    Stomach cancer Neg Hx    Esophageal cancer Neg Hx    Pancreatic cancer Neg Hx    Colon polyps Neg Hx    Rectal cancer Neg Hx     Social History Social History   Tobacco Use   Smoking status: Never   Smokeless tobacco: Never  Vaping Use   Vaping status: Never Used  Substance Use Topics   Alcohol use: Not Currently    Comment: rarely   Drug use: Never     Allergies   Tramadol , Multihance  [gadobenate], and Sulfa antibiotics   Review of Systems Review of Systems Per HPI  Physical Exam Triage Vital Signs ED Triage Vitals  Encounter Vitals Group     BP 09/07/23 1633 (!) 163/96     Systolic BP Percentile --      Diastolic BP Percentile --      Pulse  Rate 09/07/23 1633 76     Resp 09/07/23 1633 16     Temp 09/07/23 1633 98.2 F (36.8 C)     Temp Source 09/07/23 1633 Oral     SpO2 09/07/23 1633 98 %     Weight --      Height --      Head Circumference --      Peak Flow --      Pain Score 09/07/23 1634 9     Pain Loc --      Pain Education --      Exclude from Growth Chart --    No data found.  Updated Vital Signs BP (!) 154/97 (BP Location: Left Arm)   Pulse 76   Temp 98.2 F (36.8 C) (Oral)   Resp 16   SpO2 98%   Visual Acuity Right Eye Distance:   Left Eye Distance:   Bilateral Distance:    Right Eye Near:   Left Eye Near:    Bilateral Near:     Physical Exam Vitals and nursing note reviewed.  Constitutional:      Appearance: Normal appearance. She is not ill-appearing.  HENT:     Head: Atraumatic.  Eyes:      Extraocular Movements: Extraocular movements intact.     Conjunctiva/sclera: Conjunctivae normal.  Cardiovascular:     Rate and Rhythm: Normal rate and regular rhythm.     Heart sounds: Normal heart sounds.  Pulmonary:     Effort: Pulmonary effort is normal.     Breath sounds: Normal breath sounds.  Abdominal:     General: Bowel sounds are normal. There is no distension.     Palpations: Abdomen is soft.     Tenderness: There is no abdominal tenderness. There is no guarding.  Musculoskeletal:        General: Normal range of motion.     Cervical back: Normal range of motion and neck supple.  Skin:    General: Skin is warm and dry.     Findings: No bruising, erythema or rash.  Neurological:     Mental Status: She is alert and oriented to person, place, and time.     Cranial Nerves: No cranial nerve deficit.     Motor: No weakness.     Gait: Gait normal.     Comments: Bilateral upper extremities neurovascularly intact  Psychiatric:        Mood and Affect: Mood normal.        Thought Content: Thought content normal.        Judgment: Judgment normal.    UC Treatments / Results  Labs (all labs ordered are listed, but only abnormal results are displayed) Labs Reviewed - No data to display  EKG   Radiology No results found.  Procedures Procedures (including critical care time)  Medications Ordered in UC Medications - No data to display  Initial Impression / Assessment and Plan / UC Course  I have reviewed the triage vital signs and the nursing notes.  Pertinent labs & imaging results that were available during my care of the patient were reviewed by me and considered in my medical decision making (see chart for details).     Mildly hypertensive in triage today, otherwise vital signs within normal limits.  She is overall very well-appearing and in no acute distress.  Her exam is very reassuring with no red flag findings.  EKG today showing normal sinus rhythm with no ST  elevations or emergent findings.  I suspect her pain to be muscular in nature, discussed tizanidine , heat, ibuprofen  and Tylenol , rest, stretches.  PCP follow-up recommended for recheck and ED for worsening symptoms.  Final Clinical Impressions(s) / UC Diagnoses   Final diagnoses:  Left arm pain  Acute pain of left shoulder  Chest pain, unspecified type     Discharge Instructions      Your exam and EKG are reassuring today.  I suspect your pain to be muscular in nature and have sent over a muscle relaxer.  You may continue the over-the-counter pain relievers and try heat, massage, stretches, muscle rubs.  Go to the emergency department for severely worsening symptoms and follow-up with your primary care provider.    ED Prescriptions     Medication Sig Dispense Auth. Provider   tiZANidine  (ZANAFLEX ) 4 MG capsule Take 1 capsule (4 mg total) by mouth 3 (three) times daily as needed for muscle spasms. Do not drink alcohol or drive while taking this medication.  May cause drowsiness. 15 capsule Stuart Vernell Norris, [place]      PDMP not reviewed this encounter.   Stuart Vernell Norris, [place] 09/11/23 [phone]

## 2023-12-04 ENCOUNTER — Other Ambulatory Visit: Payer: Self-pay

## 2023-12-04 ENCOUNTER — Emergency Department (HOSPITAL_COMMUNITY)

## 2023-12-04 ENCOUNTER — Emergency Department (HOSPITAL_COMMUNITY)
Admission: EM | Admit: 2023-12-04 | Discharge: 2023-12-04 | Disposition: A | Discharge status: Home / Self Care | Attending: Emergency Medicine | Admitting: Emergency Medicine

## 2023-12-04 ENCOUNTER — Encounter (HOSPITAL_COMMUNITY): Payer: Self-pay

## 2023-12-04 ENCOUNTER — Other Ambulatory Visit (HOSPITAL_COMMUNITY): Payer: Self-pay

## 2023-12-04 DIAGNOSIS — R109 Unspecified abdominal pain: Secondary | ICD-10-CM | POA: Diagnosis not present

## 2023-12-04 DIAGNOSIS — R1032 Left lower quadrant pain: Secondary | ICD-10-CM | POA: Diagnosis not present

## 2023-12-04 DIAGNOSIS — Z79899 Other long term (current) drug therapy: Secondary | ICD-10-CM | POA: Diagnosis not present

## 2023-12-04 DIAGNOSIS — N2889 Other specified disorders of kidney and ureter: Secondary | ICD-10-CM | POA: Diagnosis not present

## 2023-12-04 DIAGNOSIS — K7689 Other specified diseases of liver: Secondary | ICD-10-CM | POA: Diagnosis not present

## 2023-12-04 DIAGNOSIS — D1771 Benign lipomatous neoplasm of kidney: Secondary | ICD-10-CM | POA: Diagnosis not present

## 2023-12-04 DIAGNOSIS — I10 Essential (primary) hypertension: Secondary | ICD-10-CM | POA: Diagnosis not present

## 2023-12-04 LAB — BASIC METABOLIC PANEL WITH GFR
Anion gap: 6 (ref 5–15)
BUN: 7 mg/dL (ref 6–20)
CO2: 25 mmol/L (ref 22–32)
Calcium: 9.3 mg/dL (ref 8.9–10.3)
Chloride: 110 mmol/L (ref 98–111)
Creatinine, Ser: 0.59 mg/dL (ref 0.44–1.00)
GFR, Estimated: >60 mL/min (ref >60)
Glucose, Bld: 84 mg/dL (ref 70–99)
Potassium: 3.7 mmol/L (ref 3.5–5.1)
Sodium: 141 mmol/L (ref 135–145)

## 2023-12-04 LAB — URINALYSIS, ROUTINE W REFLEX MICROSCOPIC
Bilirubin Urine: NEGATIVE
Glucose, UA: NEGATIVE mg/dL
Hgb urine dipstick: NEGATIVE
Ketones, ur: NEGATIVE mg/dL
Leukocytes,Ua: NEGATIVE
Nitrite: NEGATIVE
Protein, ur: NEGATIVE mg/dL
Specific Gravity, Urine: 1.004 — ABNORMAL LOW (ref 1.005–1.030)
pH: 7 (ref 5.0–8.0)

## 2023-12-04 LAB — CBC WITH DIFFERENTIAL/PLATELET
Abs Immature Granulocytes: 0.01 10*3/uL (ref 0.00–0.07)
Basophils Absolute: 0 10*3/uL (ref 0.0–0.1)
Basophils Relative: 0 %
Eosinophils Absolute: 0.1 10*3/uL (ref 0.0–0.5)
Eosinophils Relative: 1 %
HCT: 35 % — ABNORMAL LOW (ref 36.0–46.0)
Hemoglobin: 10.4 g/dL — ABNORMAL LOW (ref 12.0–15.0)
Immature Granulocytes: 0 %
Lymphocytes Relative: 31 %
Lymphs Abs: 2.1 10*3/uL (ref 0.7–4.0)
MCH: 20.8 pg — ABNORMAL LOW (ref 26.0–34.0)
MCHC: 29.7 g/dL — ABNORMAL LOW (ref 30.0–36.0)
MCV: 70 fL — ABNORMAL LOW (ref 80.0–100.0)
Monocytes Absolute: 0.7 10*3/uL (ref 0.1–1.0)
Monocytes Relative: 10 %
Neutro Abs: 4 10*3/uL (ref 1.7–7.7)
Neutrophils Relative %: 58 %
Platelets: 311 10*3/uL (ref 150–400)
RBC: 5 MIL/uL (ref 3.87–5.11)
RDW: 18.8 % — ABNORMAL HIGH (ref 11.5–15.5)
WBC: 6.9 10*3/uL (ref 4.0–10.5)
nRBC: 0 % (ref 0.0–0.2)

## 2023-12-04 LAB — PREGNANCY, URINE: Preg Test, Ur: NEGATIVE

## 2023-12-04 MED ORDER — ACETAMINOPHEN 325 MG PO TABS
650.0000 mg | ORAL_TABLET | Freq: Once | ORAL | Status: AC
  Administered 2023-12-04: 650 mg via ORAL
  Filled 2023-12-04: qty 2

## 2023-12-04 MED ORDER — LIDOCAINE 5 % EX PTCH
1.0000 | MEDICATED_PATCH | CUTANEOUS | Status: DC
  Administered 2023-12-04: 1 via TRANSDERMAL
  Filled 2023-12-04: qty 1

## 2023-12-04 MED ORDER — LIDOCAINE 5 % EX PTCH
1.0000 | MEDICATED_PATCH | CUTANEOUS | 0 refills | Status: AC
  Filled 2023-12-04: qty 30, 30d supply, fill #0

## 2023-12-04 NOTE — ED Notes (Signed)
 Marland Kitchen

## 2023-12-04 NOTE — ED Triage Notes (Signed)
 Patient complains of left flank pain with radiation to groin x 1 week. Denies injury, reports some new urinary frequency, no fever.  Has been taking tylenol with some relief at night with worsening pain in am. Denies hematuria and dysuria

## 2023-12-04 NOTE — ED Provider Notes (Cosign Needed Addendum)
 [place] EMERGENCY DEPARTMENT AT [facility] Provider Note   CSN: [id number] Arrival date & time: 12/04/23  1016     History  No chief complaint on file.   Amanda [name] is a 48 y.o. female history of menorrhagia, hypertension presented with left flank pain for the past week.  Patient states left flank pain goes to her groin.  Patient denies any hematuria or dysuria but states that she is having white vaginal discharge but does not want to be tested for STDs.  Patient has any fevers chest pain shortness of breath nausea vomiting.  Patient has tried Tylenol to some relief.  Patient does not have history of kidney stones.  Patient is eating and drinking without issue.    Home Medications Prior to Admission medications   Medication Sig Start Date End Date Taking? Authorizing Provider  amLODipine (NORVASC) 10 MG tablet Take 1 tablet (10 mg total) by mouth daily. 02/17/21   Tower, Marne A, MD  D3-50 1.25 MG (50000 UT) capsule Take 50,000 Units by mouth once a week. 04/22/21   [provider]  Iron-FA-B Cmp-C-Biot-Probiotic (FUSION PLUS) CAPS Take 1 capsule twice daily to replace OTC iron supplement 02/23/21   Tower, Audrie Gallus, MD  olmesartan (BENICAR) 20 MG tablet Take 0.5 tablets (10 mg total) by mouth daily. 02/17/21   Tower, Audrie Gallus, MD  tiZANidine (ZANAFLEX) 4 MG capsule Take 1 capsule (4 mg total) by mouth 3 (three) times daily as needed for muscle spasms. Do not drink alcohol or drive while taking this medication.  May cause drowsiness. 09/07/23   Particia Nearing, PA-C      Allergies    Tramadol, Multihance [gadobenate], and Sulfa antibiotics    Review of Systems   Review of Systems  Physical Exam Updated Vital Signs BP (!) 156/90 (BP Location: Left Arm)   Pulse 63   Temp 97.9 F (36.6 C)   Resp 16   SpO2 100%  Physical Exam Vitals reviewed.  Constitutional:      General: She is not in acute distress. HENT:     Head: Normocephalic and atraumatic.   Eyes:     Extraocular Movements: Extraocular movements intact.     Conjunctiva/sclera: Conjunctivae normal.     Pupils: Pupils are equal, round, and reactive to light.  Cardiovascular:     Rate and Rhythm: Normal rate and regular rhythm.     Pulses: Normal pulses.     Heart sounds: Normal heart sounds.     Comments: 2+ bilateral radial/dorsalis pedis pulses with regular rate Pulmonary:     Effort: Pulmonary effort is normal. No respiratory distress.     Breath sounds: Normal breath sounds.  Abdominal:     Palpations: Abdomen is soft.     Tenderness: There is no abdominal tenderness. There is no right CVA tenderness, left CVA tenderness, guarding or rebound.  Musculoskeletal:        General: Normal range of motion.     Cervical back: Normal range of motion and neck supple.     Comments: 5 out of 5 bilateral grip/leg extension strength  Skin:    General: Skin is warm and dry.     Capillary Refill: Capillary refill takes less than 2 seconds.  Neurological:     General: No focal deficit present.     Mental Status: She is alert and oriented to person, place, and time.     Comments: Sensation intact in all 4 limbs  Psychiatric:  Mood and Affect: Mood normal.     ED Results / Procedures / Treatments   Labs (all labs ordered are listed, but only abnormal results are displayed) Labs Reviewed  URINALYSIS, ROUTINE W REFLEX MICROSCOPIC - Abnormal; Notable for the following components:      Result Value   Color, Urine STRAW (*)    Specific Gravity, Urine 1.004 (*)    All other components within normal limits  PREGNANCY, URINE  BASIC METABOLIC PANEL WITH GFR  CBC WITH DIFFERENTIAL/PLATELET    EKG None  Radiology No results found.  Procedures Procedures    Medications Ordered in ED Medications - No data to display  ED Course/ Medical Decision Making/ A&P Clinical Course as of 12/04/23 1503  Mon Dec 04, 2023  1501 L flank pain to groin x1 week, white vaginal d/c  new does not want STD testing or pelvic  [ ]  f/u CT scan - get home if negative [HG]    Clinical Course User Index [HG] Renella Cunas, MD                                 Medical Decision Making Amount and/or Complexity of Data Reviewed Labs: ordered. Radiology: ordered.  Risk OTC drugs. Prescription drug management.   Amanda [name] 48 y.o. presented today for flank pain. Working DDx that I considered at this time includes, but not limited to, MSK, nephrolithiasis, pyelonephritis, AAA, aortic dissection, mesenteric ischemia, RCC, obstructive uropathy, renal infarct/hemorrhage, tumor, biliary colic, pancreatitis, appendicitis, SBO, diverticulitis, shingles, lower lobe pneumonia, ectopic pregnancy, PID/TOA, ovarian torsion, endometriosis,  testicular torsion, epididymitis.  R/o DDx: Pending  Review of prior external notes: 10/30/2023 outpatient reach  Unique Tests and My Independent Interpretation:  CBC: Unremarkable BMP: Unremarkable UA: Unremarkable CT Renal stone study: Ending  Social Determinants of Health: none  Discussion with Independent Historian: None  Discussion of Management of Tests: None  Risk: Medium: prescription drug management  Risk Stratification Score: None  EMERGENCY DEPARTMENT US RENAL EXAM  "Study: Limited Retroperitoneal Ultrasound of Kidneys"  INDICATIONS: Flank pain Long and short axis of both kidneys were obtained.   PERFORMED BY: Myself IMAGES ARCHIVED?: Yes LIMITATIONS: Body habitus VIEWS USED: Long axis  INTERPRETATION: No Hydronephrosis, No Renal cyst, No Kidney stone  Plan: On exam patient was no acute distress with stable vitals. Physical exam showed no CVA tenderness or abdominal tenderness or peritoneal signs.  Patient stated that she was having white discharge from her vaginal area but does not want to be tested for STDs, pelvic exam, or pelvic ultrasound.  Patient does understand that this will limit the evaluation may lead to  missed diagnoses.  Patient does have full decision-making capacity.  Bedside ultrasound does not show hydronephrosis or other abnormalities however patient is that she is having left flank pain going to her groin suspicious of possible stones will get CT imaging.  Patient does not have any blood in her urine.  Do question of this could be MSK.  Will give Tylenol and lidocaine patch.  Patient signed out to Clemon Chambers, MD.  Please review their note for the continuation of patient's care.  The plan at this point is follow-up and imaging and discharge if negative.  This chart was dictated using voice recognition software.  Despite best efforts to proofread,  errors can occur which can change the documentation meaning.        Final Clinical Impression(s) / ED Diagnoses Final  diagnoses:  None    Rx / DC Orders ED Discharge Orders     None         Remi Deter 12/04/23 1503    Netta Corrigan, PA-C 12/04/23 1709    Arby Barrette, MD 12/05/23 [phone]

## 2023-12-04 NOTE — Discharge Instructions (Addendum)
 Please follow-up with your primary care provider in regards to recent ER visit.  Today your labs and imaging are reassuring and you may take ibuprofen 800 mg every 8 hours and 1000 mg Tylenol every 8 hours needed for pain.  I prescribed you lidocaine patches take as well.  If symptoms change or worsen please return to the ER.

## 2023-12-04 NOTE — ED Provider Notes (Signed)
  Physical Exam  BP 132/80   Pulse 60   Temp 97.9 F (36.6 C)   Resp 16   SpO2 100%   Physical Exam  Procedures  Procedures  ED Course / MDM   Clinical Course as of 12/04/23 1610  Mon Dec 04, 2023  1501 L flank pain to groin x1 week, white vaginal d/c new does not want STD testing or pelvic  [ ]  f/u CT scan - get home if negative [HG]    Clinical Course User Index [HG] Renella Cunas, MD   Medical Decision Making Amount and/or Complexity of Data Reviewed Labs: ordered. Radiology: ordered.  Risk OTC drugs. Prescription drug management.   At the time of handoff, I was awaiting results of CT scan.  CT scan did not show any acute abnormalities, there is no evidence of nephrolithiasis, hydronephrosis, inflammatory bowel, or mass or abnormality in the reproductive region.  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  Advised patient on pain management options as well as strict return precautions and patient was discharged in hemodynamically stable condition.  Renella Cunas, PGY 2  emergency medicine   Renella Cunas, MD 12/04/23 1610    Glyn Ade, MD 12/07/23 [phone]

## 2024-02-01 ENCOUNTER — Ambulatory Visit: Payer: Self-pay | Admitting: Family Medicine
